# Patient Record
Sex: Female | Born: 1945 | Race: White | Hispanic: No | Marital: Married | Smoking: Never smoker
Health system: Southern US, Community
[De-identification: ages and names within clinical notes are randomized; demographics above are authoritative.]

## PROBLEM LIST (undated history)

## (undated) DIAGNOSIS — J45909 Unspecified asthma, uncomplicated: Secondary | ICD-10-CM

## (undated) DIAGNOSIS — K219 Gastro-esophageal reflux disease without esophagitis: Secondary | ICD-10-CM

## (undated) DIAGNOSIS — R7303 Prediabetes: Secondary | ICD-10-CM

## (undated) DIAGNOSIS — I10 Essential (primary) hypertension: Secondary | ICD-10-CM

## (undated) DIAGNOSIS — T8859XA Other complications of anesthesia, initial encounter: Secondary | ICD-10-CM

## (undated) DIAGNOSIS — M1990 Unspecified osteoarthritis, unspecified site: Secondary | ICD-10-CM

## (undated) DIAGNOSIS — J189 Pneumonia, unspecified organism: Secondary | ICD-10-CM

## (undated) DIAGNOSIS — E785 Hyperlipidemia, unspecified: Secondary | ICD-10-CM

## (undated) HISTORY — PX: EYE SURGERY: SHX253

## (undated) HISTORY — PX: BLADDER SURGERY: SHX569

## (undated) HISTORY — PX: HAMMER TOE SURGERY: SHX385

## (undated) HISTORY — PX: CARDIAC CATHETERIZATION: SHX172

## (undated) HISTORY — PX: GANGLION CYST EXCISION: SHX1691

---

## 2012-08-10 ENCOUNTER — Encounter: Payer: Self-pay | Admitting: Sports Medicine

## 2012-08-10 ENCOUNTER — Ambulatory Visit (INDEPENDENT_AMBULATORY_CARE_PROVIDER_SITE_OTHER): Payer: MEDICARE | Admitting: Sports Medicine

## 2012-08-10 VITALS — BP 169/83 | HR 60 | Ht 67.0 in | Wt 175.0 lb

## 2012-08-10 DIAGNOSIS — M25569 Pain in unspecified knee: Secondary | ICD-10-CM

## 2012-08-10 DIAGNOSIS — IMO0002 Reserved for concepts with insufficient information to code with codable children: Secondary | ICD-10-CM

## 2012-08-10 DIAGNOSIS — M25561 Pain in right knee: Secondary | ICD-10-CM

## 2012-08-10 DIAGNOSIS — M1710 Unilateral primary osteoarthritis, unspecified knee: Secondary | ICD-10-CM

## 2012-08-10 DIAGNOSIS — M25562 Pain in left knee: Secondary | ICD-10-CM

## 2012-08-10 NOTE — Assessment & Plan Note (Signed)
Received bilateral injections of lidocaine and depomedrol 40 mg . She will return if no relief from steroid injections.
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
## 2013-02-15 ENCOUNTER — Ambulatory Visit (INDEPENDENT_AMBULATORY_CARE_PROVIDER_SITE_OTHER): Payer: MEDICARE | Admitting: Sports Medicine

## 2013-02-15 ENCOUNTER — Encounter: Payer: Self-pay | Admitting: Sports Medicine

## 2013-02-15 ENCOUNTER — Ambulatory Visit
Admission: RE | Admit: 2013-02-15 | Discharge: 2013-02-15 | Disposition: A | Discharge status: Home / Self Care | Payer: MEDICARE | Source: Ambulatory Visit | Attending: Sports Medicine | Admitting: Sports Medicine

## 2013-02-15 VITALS — BP 175/95 | HR 65 | Ht 67.0 in | Wt 175.0 lb

## 2013-02-15 DIAGNOSIS — M25569 Pain in unspecified knee: Secondary | ICD-10-CM

## 2013-02-15 DIAGNOSIS — M25562 Pain in left knee: Secondary | ICD-10-CM

## 2013-02-15 DIAGNOSIS — M25561 Pain in right knee: Secondary | ICD-10-CM

## 2013-02-16 NOTE — Progress Notes (Signed)
  Subjective:    Patient ID: Sharon Wang, female    DOB: 1946-08-03, 67 y.o.   MRN: [id number]  HPI Patient comes in today complaining of persistent bilateral knee pain. She was last seen in the office back in August and I injected each of her knees with cortisone. Unfortunately, she did not get the same results as she had previously with injections. She describes a diffuse discomfort in the knees which is worse with activity. No swelling. Her pain is interfering with her quality of life. She's been using Voltaren gel which has been of very little benefit. She's also taking when necessary Ultram which does "take the edge off". No recent trauma. No mechanical symptoms.  Medications and past medical history is available for review on the chart.    Review of Systems     Objective:   Physical Exam Well-developed, well-nourished. No acute distress. Awake alert and oriented x3  Examination of each of her knees shows range of motion from 0-120. No effusion. Slight tenderness to palpation along both medial and lateral joint lines bilaterally. 1+ patellofemoral crepitus with active extension. She has a mild valgus thrust on the right with standing. Neurovascularly intact distally.       Assessment & Plan:  1. Persistent bilateral knee pain likely secondary to advanced DJD  X-rays of each of her knees including standing AP, lateral, and 30 flexion views in addition to a sunrise view. If advanced OA is seen patient would be interested in meeting with an orthopedic surgeon to discuss merits of total knee arthroplasty. If possible, she would like to have this done at [facility]. I will call her after I reviewed her x-rays.

## 2013-02-23 ENCOUNTER — Telehealth: Payer: Self-pay | Admitting: *Deleted

## 2013-02-23 ENCOUNTER — Telehealth: Payer: Self-pay | Admitting: Sports Medicine

## 2013-02-23 NOTE — Telephone Encounter (Signed)
I spoke with Sharon Dandy on the phone yesterday regarding x-rays of her knees. 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 Only mild degenerative changes of the left knee. She would like to see an orthopedist in [place]. I will refer her to Dr. Corinna Capra for further workup and treatment. She may be a candidate for a limited patellofemoral arthroplasty. She will followup with me when necessary

## 2013-02-23 NOTE — Telephone Encounter (Signed)
Sent referral info to [facility] at Dr. Amanda Pea office phone [phone], fax [phone]. She will call me back with appt info.

## 2013-02-23 NOTE — Telephone Encounter (Signed)
Message copied by Jacki Cones C on Tue Feb 23, 2013  2:20 PM ------      Message from: Reino Bellis R      Created: Mon Feb 22, 2013  1:30 PM      Regarding: referral       Please refer to Dr Pill in [place] for knee djd                  ----- Message -----         From: Rad Results In Interface         Sent: 02/15/2013   1:00 PM           To: Ralene Cork, DO                   ------
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
## 2013-03-02 NOTE — Telephone Encounter (Signed)
Pt scheduled for an appt with Dr. Corinna Capra 03/10/13 @ 3pm in the [place] office.

## 2014-03-29 IMAGING — CR DG KNEE COMPLETE 4+V*L*
4 series · 4 of 4 positions shown · non-contrast
Comparison: None.

CLINICAL DATA: Bilateral knee pain, no injury

LEFT KNEE - COMPLETE 4+ VIEW

[view not recorded (1 of 4)]
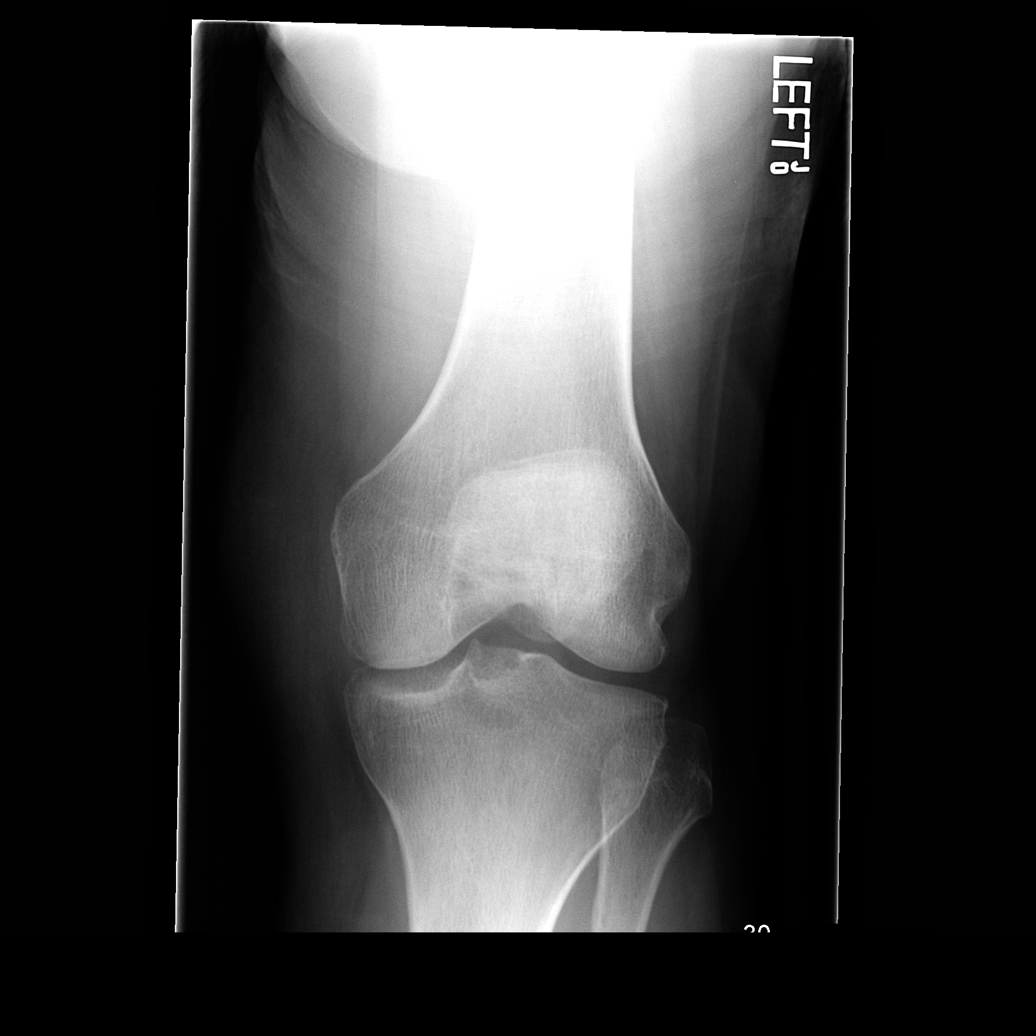

[view not recorded (2 of 4)]
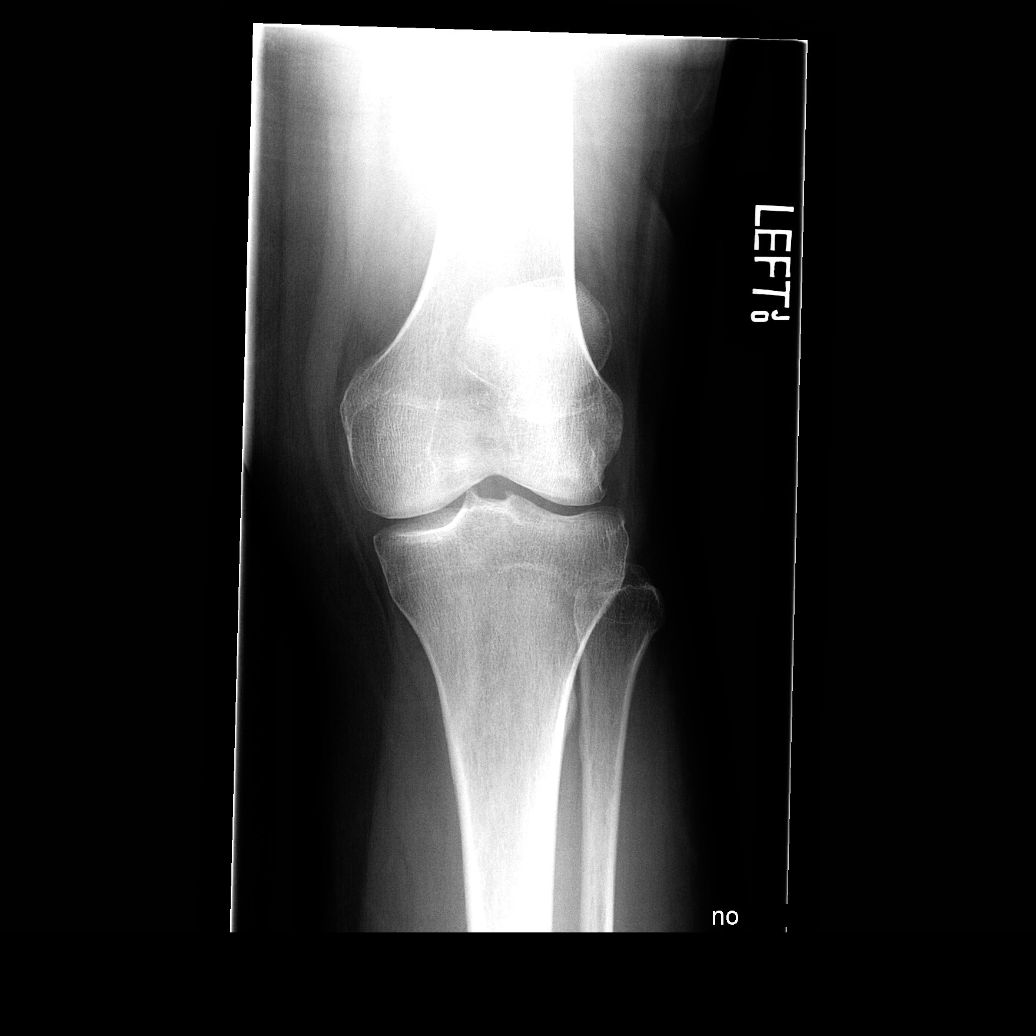

[view not recorded (3 of 4)]
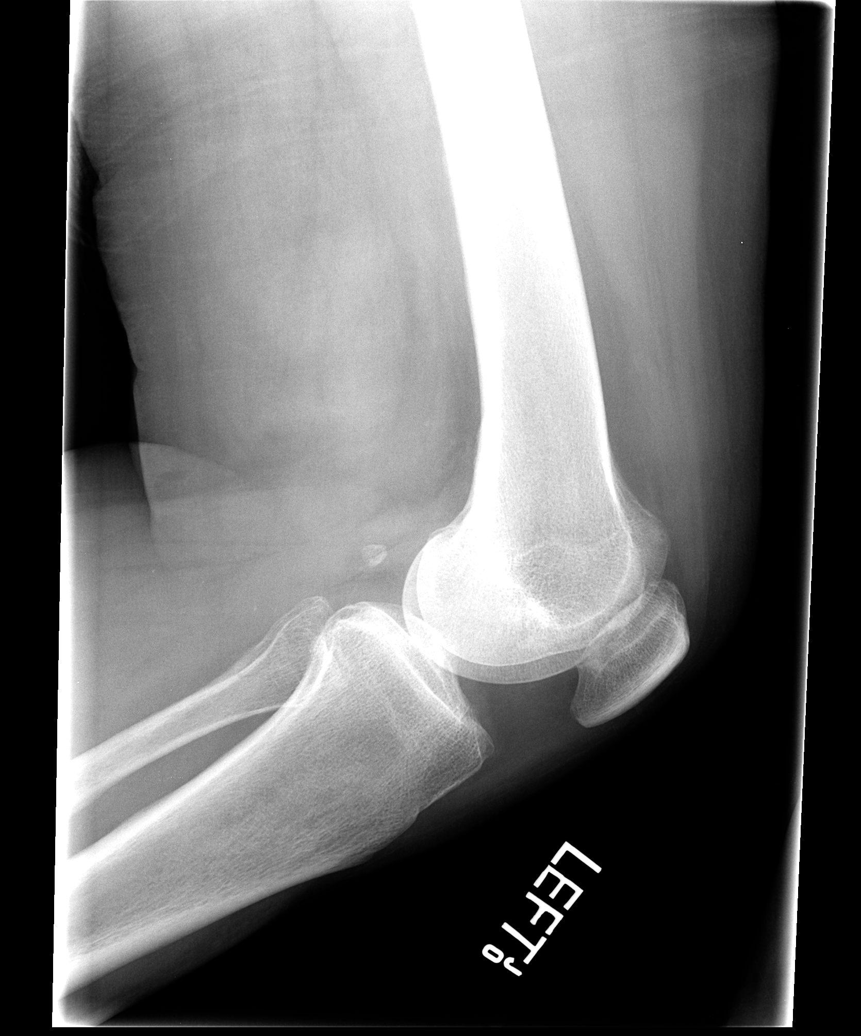

[view not recorded (4 of 4)]
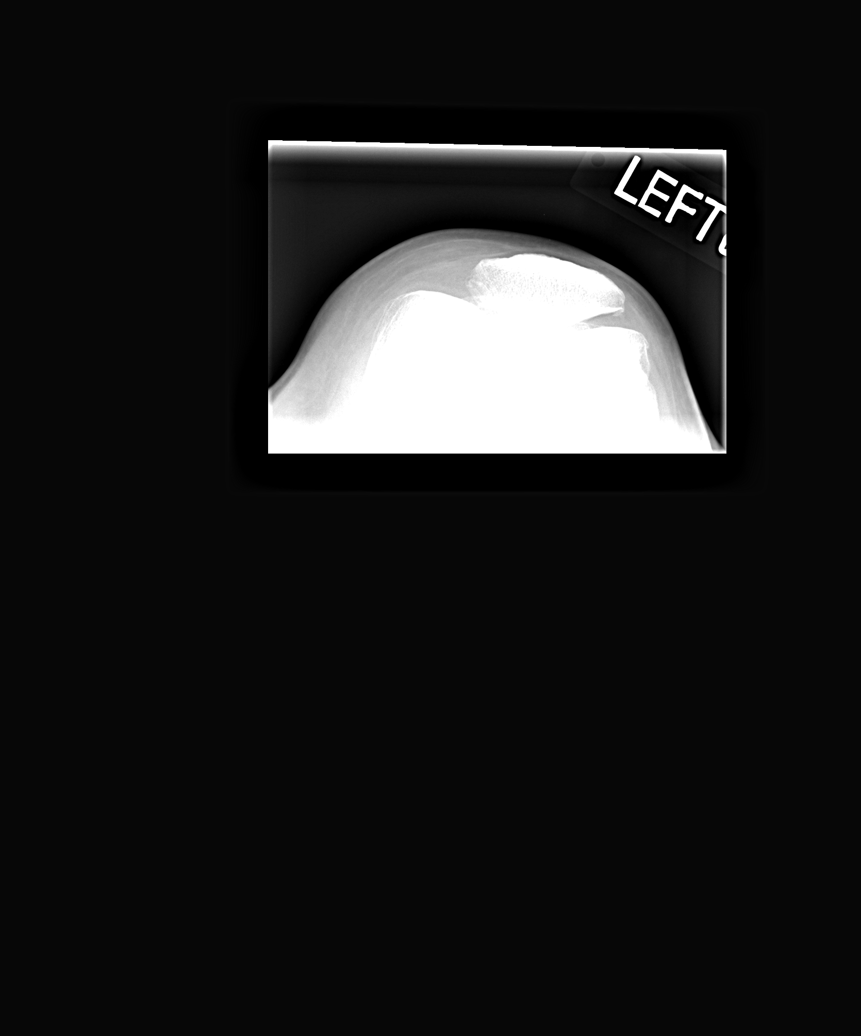

[4 of 4 positions shown; findings below may reference images not displayed]

FINDINGS: There is mild degenerative joint disease primarily
involving the patellofemoral articulation where there is some loss
of AC joint space and mild spurring.  The remainder joint spaces
appear relatively normal.  No effusion is seen.  The patella is
normally positioned within the patellofemoral groove.
IMPRESSION: Mild patellofemoral degenerative joint disease.

## 2018-06-19 ENCOUNTER — Other Ambulatory Visit: Payer: Self-pay | Admitting: Orthopedic Surgery

## 2018-07-02 ENCOUNTER — Encounter (HOSPITAL_BASED_OUTPATIENT_CLINIC_OR_DEPARTMENT_OTHER): Payer: Self-pay | Admitting: *Deleted

## 2018-07-03 ENCOUNTER — Other Ambulatory Visit: Payer: Self-pay

## 2018-07-03 ENCOUNTER — Encounter (HOSPITAL_BASED_OUTPATIENT_CLINIC_OR_DEPARTMENT_OTHER): Payer: Self-pay | Admitting: *Deleted

## 2018-07-09 ENCOUNTER — Ambulatory Visit (HOSPITAL_BASED_OUTPATIENT_CLINIC_OR_DEPARTMENT_OTHER): Payer: Medicare Other | Admitting: Anesthesiology

## 2018-07-09 ENCOUNTER — Encounter (HOSPITAL_BASED_OUTPATIENT_CLINIC_OR_DEPARTMENT_OTHER): Payer: Self-pay | Admitting: Anesthesiology

## 2018-07-09 ENCOUNTER — Encounter (HOSPITAL_BASED_OUTPATIENT_CLINIC_OR_DEPARTMENT_OTHER)
Admission: RE | Disposition: A | Discharge status: Home / Self Care | Payer: Self-pay | Source: Ambulatory Visit | Attending: Orthopedic Surgery

## 2018-07-09 ENCOUNTER — Ambulatory Visit (HOSPITAL_BASED_OUTPATIENT_CLINIC_OR_DEPARTMENT_OTHER)
Admission: RE | Admit: 2018-07-09 | Discharge: 2018-07-09 | Disposition: A | Discharge status: Home / Self Care | Payer: Medicare Other | Source: Ambulatory Visit | Attending: Orthopedic Surgery | Admitting: Orthopedic Surgery

## 2018-07-09 DIAGNOSIS — K219 Gastro-esophageal reflux disease without esophagitis: Secondary | ICD-10-CM | POA: Insufficient documentation

## 2018-07-09 DIAGNOSIS — J45909 Unspecified asthma, uncomplicated: Secondary | ICD-10-CM | POA: Insufficient documentation

## 2018-07-09 DIAGNOSIS — I10 Essential (primary) hypertension: Secondary | ICD-10-CM | POA: Insufficient documentation

## 2018-07-09 DIAGNOSIS — M25841 Other specified joint disorders, right hand: Secondary | ICD-10-CM | POA: Diagnosis present

## 2018-07-09 DIAGNOSIS — M19041 Primary osteoarthritis, right hand: Secondary | ICD-10-CM | POA: Diagnosis not present

## 2018-07-09 HISTORY — DX: Gastro-esophageal reflux disease without esophagitis: K21.9

## 2018-07-09 HISTORY — DX: Essential (primary) hypertension: I10

## 2018-07-09 HISTORY — DX: Unspecified asthma, uncomplicated: J45.909

## 2018-07-09 HISTORY — PX: MASS EXCISION: SHX2000

## 2018-07-09 HISTORY — DX: Unspecified osteoarthritis, unspecified site: M19.90

## 2018-07-09 SURGERY — EXCISION MASS
Anesthesia: Regional | Site: Hand | Laterality: Right

## 2018-07-09 MED ORDER — PROPOFOL 10 MG/ML IV BOLUS
INTRAVENOUS | Status: DC | PRN
  Administered 2018-07-09: 20 mg via INTRAVENOUS
  Administered 2018-07-09: 10 mg via INTRAVENOUS

## 2018-07-09 MED ORDER — VANCOMYCIN HCL IN DEXTROSE 1-5 GM/200ML-% IV SOLN
INTRAVENOUS | Status: AC
  Filled 2018-07-09: qty 200

## 2018-07-09 MED ORDER — LACTATED RINGERS IV SOLN
INTRAVENOUS | Status: DC
  Administered 2018-07-09: 08:00:00 via INTRAVENOUS

## 2018-07-09 MED ORDER — ONDANSETRON HCL 4 MG/2ML IJ SOLN
INTRAMUSCULAR | Status: AC
  Filled 2018-07-09: qty 2

## 2018-07-09 MED ORDER — ONDANSETRON HCL 4 MG/2ML IJ SOLN
INTRAMUSCULAR | Status: DC | PRN
  Administered 2018-07-09: 4 mg via INTRAVENOUS

## 2018-07-09 MED ORDER — VANCOMYCIN HCL IN DEXTROSE 1-5 GM/200ML-% IV SOLN: 1000.0000 mg | INTRAVENOUS | Status: DC

## 2018-07-09 MED ORDER — OXYCODONE HCL 5 MG/5ML PO SOLN
5.0000 mg | Freq: Once | ORAL | Status: DC | PRN
Start: 2018-07-09 — End: 2018-07-09

## 2018-07-09 MED ORDER — MEPERIDINE HCL 25 MG/ML IJ SOLN: 6.2500 mg | INTRAMUSCULAR | Status: DC | PRN

## 2018-07-09 MED ORDER — LIDOCAINE HCL (PF) 0.5 % IJ SOLN
INTRAMUSCULAR | Status: DC | PRN
  Administered 2018-07-09: 35 mL via INTRAVENOUS

## 2018-07-09 MED ORDER — FENTANYL CITRATE (PF) 100 MCG/2ML IJ SOLN
50.0000 ug | INTRAMUSCULAR | Status: DC | PRN
  Administered 2018-07-09: 2 ug via INTRAVENOUS

## 2018-07-09 MED ORDER — LIDOCAINE HCL (CARDIAC) PF 100 MG/5ML IV SOSY
PREFILLED_SYRINGE | INTRAVENOUS | Status: AC
  Filled 2018-07-09: qty 5

## 2018-07-09 MED ORDER — CEFAZOLIN SODIUM-DEXTROSE 2-4 GM/100ML-% IV SOLN
INTRAVENOUS | Status: AC
  Filled 2018-07-09: qty 100

## 2018-07-09 MED ORDER — LIDOCAINE HCL (CARDIAC) PF 100 MG/5ML IV SOSY
PREFILLED_SYRINGE | INTRAVENOUS | Status: DC | PRN
  Administered 2018-07-09: 20 mg via INTRAVENOUS

## 2018-07-09 MED ORDER — FENTANYL CITRATE (PF) 100 MCG/2ML IJ SOLN: 25.0000 ug | INTRAMUSCULAR | Status: DC | PRN

## 2018-07-09 MED ORDER — MIDAZOLAM HCL 2 MG/2ML IJ SOLN: 1.0000 mg | INTRAMUSCULAR | Status: DC | PRN

## 2018-07-09 MED ORDER — DEXAMETHASONE SODIUM PHOSPHATE 10 MG/ML IJ SOLN
INTRAMUSCULAR | Status: DC | PRN
  Administered 2018-07-09: 5 mg via INTRAVENOUS

## 2018-07-09 MED ORDER — CHLORHEXIDINE GLUCONATE 4 % EX LIQD: 60.0000 mL | Freq: Once | CUTANEOUS | Status: DC

## 2018-07-09 MED ORDER — CEFAZOLIN SODIUM-DEXTROSE 2-3 GM-%(50ML) IV SOLR
INTRAVENOUS | Status: DC | PRN
  Administered 2018-07-09: 2 g via INTRAVENOUS

## 2018-07-09 MED ORDER — SCOPOLAMINE 1 MG/3DAYS TD PT72: 1.0000 | MEDICATED_PATCH | Freq: Once | TRANSDERMAL | Status: DC | PRN

## 2018-07-09 MED ORDER — BUPIVACAINE HCL (PF) 0.25 % IJ SOLN
INTRAMUSCULAR | Status: DC | PRN
  Administered 2018-07-09: 8.5 mL

## 2018-07-09 MED ORDER — FENTANYL CITRATE (PF) 100 MCG/2ML IJ SOLN
INTRAMUSCULAR | Status: AC
  Filled 2018-07-09: qty 2

## 2018-07-09 MED ORDER — DEXAMETHASONE SODIUM PHOSPHATE 10 MG/ML IJ SOLN
INTRAMUSCULAR | Status: AC
  Filled 2018-07-09: qty 1

## 2018-07-09 MED ORDER — OXYCODONE HCL 5 MG PO TABS: 5.0000 mg | ORAL_TABLET | Freq: Once | ORAL | Status: DC | PRN

## 2018-07-09 SURGICAL SUPPLY — 40 items
BANDAGE COBAN STERILE 2 (GAUZE/BANDAGES/DRESSINGS) IMPLANT
BLADE SURG 15 STRL LF DISP TIS (BLADE) ×1 IMPLANT
BLADE SURG 15 STRL SS (BLADE) ×1
BNDG COHESIVE 1X5 TAN STRL LF (GAUZE/BANDAGES/DRESSINGS) ×2 IMPLANT
BNDG COHESIVE 3X5 TAN STRL LF (GAUZE/BANDAGES/DRESSINGS) IMPLANT
BNDG ESMARK 4X9 LF (GAUZE/BANDAGES/DRESSINGS) ×2 IMPLANT
BNDG GAUZE ELAST 4 BULKY (GAUZE/BANDAGES/DRESSINGS) IMPLANT
CHLORAPREP W/TINT 26ML (MISCELLANEOUS) ×2 IMPLANT
CORD BIPOLAR FORCEPS 12FT (ELECTRODE) ×2 IMPLANT
COVER BACK TABLE 60X90IN (DRAPES) ×2 IMPLANT
COVER MAYO STAND STRL (DRAPES) ×2 IMPLANT
CUFF TOURNIQUET SINGLE 18IN (TOURNIQUET CUFF) ×2 IMPLANT
DECANTER SPIKE VIAL GLASS SM (MISCELLANEOUS) IMPLANT
DRAIN PENROSE 1/2X12 LTX STRL (WOUND CARE) IMPLANT
DRAPE EXTREMITY T 121X128X90 (DRAPE) ×2 IMPLANT
DRAPE SURG 17X23 STRL (DRAPES) ×2 IMPLANT
GAUZE SPONGE 4X4 12PLY STRL (GAUZE/BANDAGES/DRESSINGS) ×2 IMPLANT
GAUZE XEROFORM 1X8 LF (GAUZE/BANDAGES/DRESSINGS) ×2 IMPLANT
GLOVE BIOGEL PI IND STRL 8.5 (GLOVE) ×1 IMPLANT
GLOVE BIOGEL PI INDICATOR 8.5 (GLOVE) ×1
GLOVE SURG ORTHO 8.0 STRL STRW (GLOVE) ×2 IMPLANT
GOWN STRL REUS W/ TWL LRG LVL3 (GOWN DISPOSABLE) ×1 IMPLANT
GOWN STRL REUS W/TWL LRG LVL3 (GOWN DISPOSABLE) ×1
GOWN STRL REUS W/TWL XL LVL3 (GOWN DISPOSABLE) ×2 IMPLANT
NDL SAFETY ECLIPSE 18X1.5 (NEEDLE) IMPLANT
NEEDLE HYPO 18GX1.5 SHARP (NEEDLE)
NEEDLE PRECISIONGLIDE 27X1.5 (NEEDLE) ×2 IMPLANT
NS IRRIG 1000ML POUR BTL (IV SOLUTION) ×2 IMPLANT
PACK BASIN DAY SURGERY FS (CUSTOM PROCEDURE TRAY) ×2 IMPLANT
PAD CAST 3X4 CTTN HI CHSV (CAST SUPPLIES) IMPLANT
PADDING CAST COTTON 3X4 STRL (CAST SUPPLIES)
SPLINT PLASTER CAST XFAST 3X15 (CAST SUPPLIES) IMPLANT
SPLINT PLASTER XTRA FASTSET 3X (CAST SUPPLIES)
STOCKINETTE 4X48 STRL (DRAPES) ×2 IMPLANT
SUT ETHILON 4 0 PS 2 18 (SUTURE) ×2 IMPLANT
SUT VIC AB 4-0 P2 18 (SUTURE) IMPLANT
SYR BULB 3OZ (MISCELLANEOUS) ×2 IMPLANT
SYR CONTROL 10ML LL (SYRINGE) ×2 IMPLANT
TOWEL GREEN STERILE FF (TOWEL DISPOSABLE) ×2 IMPLANT
UNDERPAD 30X30 (UNDERPADS AND DIAPERS) ×2 IMPLANT

## 2018-07-09 NOTE — Anesthesia Postprocedure Evaluation (Signed)
Anesthesia Post Note  Patient: Sharon Wang  Procedure(s) Performed: EXCISION CYST DEBRIDEMENT DISTAL INTERPHALANGEAL RIGHT INDEX FINGER (Right Hand)     Patient location during evaluation: PACU Anesthesia Type: Bier Block Level of consciousness: awake and alert Pain management: pain level controlled Vital Signs Assessment: post-procedure vital signs reviewed and stable Respiratory status: spontaneous breathing Cardiovascular status: stable Anesthetic complications: no    Last Vitals:  Vitals:   07/09/18 0955 07/09/18 1024  BP:  (!) 145/77  Pulse: (!) 55 (!) 52  Resp: 11 18  Temp:  36.6 C  SpO2: 100% 100%    Last Pain:  Vitals:   07/09/18 1024  TempSrc:   PainSc: 0-No pain                 Lewie LoronJohn Richanda Darin

## 2018-07-09 NOTE — Discharge Instructions (Addendum)

## 2018-07-09 NOTE — Anesthesia Procedure Notes (Signed)
Anesthesia Regional Block: Bier block (IV Regional)   Pre-Anesthetic Checklist: ,, timeout performed, Correct Patient, Correct Site, Correct Laterality, Correct Procedure,, site marked, surgical consent,, at surgeon's request  Laterality: Right     Needles:  Injection technique: Single-shot  Needle Type: Other      Needle Gauge: 20     Additional Needles:   Procedures:,,,,, intact distal pulses, Esmarch exsanguination, single tourniquet utilized,  Narrative:   Performed by: Personally       

## 2018-07-09 NOTE — Brief Op Note (Signed)
07/09/2018  9:48 AM  PATIENT:  Sharon PattyMary Wang  72 y.o. female  PRE-OPERATIVE DIAGNOSIS:  mucoid tumor DJD Right Index  POST-OPERATIVE DIAGNOSIS:  mucoid tumor DJD Right Index  PROCEDURE:  Procedure(s): EXCISION CYST DEBRIDEMENT DISTAL INTERPHALANGEAL RIGHT INDEX FINGER (Right)  SURGEON:  Surgeon(s) and Role:    Cindee Salt* Jamicheal Heard, MD - Primary  PHYSICIAN ASSISTANT:   ASSISTANTS: none   ANESTHESIA:   local, regional and IV sedation  EBL:  1 mL   BLOOD ADMINISTERED:none  DRAINS: none   LOCAL MEDICATIONS USED:  BUPIVICAINE   SPECIMEN:  Excision  DISPOSITION OF SPECIMEN:  PATHOLOGY  COUNTS:  YES  TOURNIQUET:   Total Tourniquet Time Documented: Forearm (Right) - 22 minutes Total: Forearm (Right) - 22 minutes   DICTATION: .Dragon Dictation  PLAN OF CARE: Discharge to home after PACU  PATIENT DISPOSITION:  PACU - hemodynamically stable.

## 2018-07-09 NOTE — H&P (Signed)
  Sharon PattyMary Wang is an 72 y.o. female.   Chief Complaint: mass index right HPI: Sharon Wang is a 72 year old right-hand-dominant female who is seen with a mass on her right index finger. This been present for 2 to 3 weeks. She recalls no history of injury. Is not causing her any pain or discomfort. She has no history of diabetes thyroid problems or gout. She does have history of arthritis. Negative for each of these also. She has not tried taking anything for this.      Past Medical History:  Diagnosis Date  . Arthritis    hands, knees and back  . Asthma   . GERD (gastroesophageal reflux disease)   . Hypertension     Past Surgical History:  Procedure Laterality Date  . BLADDER SURGERY    . CARDIAC CATHETERIZATION    . GANGLION CYST EXCISION Left   . HAMMER TOE SURGERY Left     History reviewed. No pertinent family history. Social History:  reports that she has never smoked. She has never used smokeless tobacco. She reports that she drinks alcohol. She reports that she does not use drugs.  Allergies:  Allergies  Allergen Reactions  . Norvasc [Amlodipine Besylate] Anaphylaxis    Aching joints  . Arthrotec [Diclofenac-Misoprostol] Hives  . Calcium Channel Blockers Hives  . Ciprofloxacin Hives  . Isosorbide   . Metronidazole Hives  . Penicillins Hives    Can take cefalexin  . Pravastatin Hives  . Clindamycin/Lincomycin Rash  . Sulfa Antibiotics Rash    No medications prior to admission.    No results found for this or any previous visit (from the past 48 hour(s)).  No results found.   Pertinent items are noted in HPI.  Height 5\' 7"  (1.702 m), weight 79.4 kg (175 lb).  General appearance: alert, cooperative and appears stated age Head: Normocephalic, without obvious abnormality Neck: no JVD Resp: clear to auscultation bilaterally Cardio: regular rate and rhythm, S1, S2 normal, no murmur, click, rub or gallop GI: soft, non-tender; bowel sounds normal; no masses,  no  organomegaly Extremities: mass right index finger Pulses: 2+ and symmetric Skin: Skin color, texture, turgor normal. No rashes or lesions Neurologic: Grossly normal Incision/Wound: na  Assessment/Plan Assessment:  1. Osteoarthritis of finger of right hand  2. Mucoid cyst, joint    Plan: Have discussed the etiology of this with her. We have discussed a surgical excision debridement of the joint. Pre-peri-and postoperative course are discussed along with risk complications. She is aware that there is no guarantee to the surgery the possibility of infection recurrence injury to arteries nerves tendons complete relief symptoms dystrophy. Would like to proceed. She will schedule for excision mucoid cyst debridement distal phalangeal joint right index fingers as an outpatient under regional anesthesia.      Kena Limon R 07/09/2018, 5:38 AM

## 2018-07-09 NOTE — Anesthesia Preprocedure Evaluation (Signed)
Anesthesia Evaluation  Patient identified by MRN, date of birth, ID band Patient awake    Reviewed: Allergy & Precautions, NPO status , Patient's Chart, lab work & pertinent test results, reviewed documented beta blocker date and time   Airway Mallampati: II  TM Distance: >3 FB Neck ROM: Full    Dental no notable dental hx.    Pulmonary asthma ,    Pulmonary exam normal breath sounds clear to auscultation       Cardiovascular hypertension, Pt. on home beta blockers and Pt. on medications Normal cardiovascular exam Rhythm:Regular Rate:Normal     Neuro/Psych negative neurological ROS  negative psych ROS   GI/Hepatic Neg liver ROS, GERD  ,  Endo/Other  negative endocrine ROS  Renal/GU negative Renal ROS     Musculoskeletal  (+) Arthritis ,   Abdominal   Peds  Hematology negative hematology ROS (+)   Anesthesia Other Findings   Reproductive/Obstetrics negative OB ROS                             Anesthesia Physical Anesthesia Plan  ASA: II  Anesthesia Plan: Bier Block and Bier Block-LIDOCAINE ONLY   Post-op Pain Management:    Induction: Intravenous  PONV Risk Score and Plan: 2 and Ondansetron, Propofol infusion and TIVA  Airway Management Planned:   Additional Equipment:   Intra-op Plan:   Post-operative Plan:   Informed Consent: I have reviewed the patients History and Physical, chart, labs and discussed the procedure including the risks, benefits and alternatives for the proposed anesthesia with the patient or authorized representative who has indicated his/her understanding and acceptance.   Dental advisory given  Plan Discussed with: CRNA  Anesthesia Plan Comments:         Anesthesia Quick Evaluation

## 2018-07-09 NOTE — Op Note (Signed)
NAME: Sharon Wang MEDICAL RECORD NO: [id number] DATE OF BIRTH: Sep 08, 1946 [facility] PHYSICIAN: Nicki ReaperGARY R. 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   ASSISTANT: none   ANESTHESIA:  Bier block with sedation   INTRAVENOUS FLUIDS:  Per anesthesia flow sheet.   ESTIMATED BLOOD LOSS:  Minimal.   COMPLICATIONS:  None.   SPECIMENS:   Osteophytes cyst distal phalangeal joint right index finger   TOURNIQUET TIME:    Total Tourniquet Time Documented: Forearm (Right) - 22 minutes Total: Forearm (Right) - 22 minutes    DISPOSITION:  Stable to PACU.   INDICATIONS: Patient is a 72 year old female with a history of a mass of the dorsal aspect distal interphalangeal joint right index finger.  This does transilluminate.  X-rays reveal degenerative arthritis of the distal phalangeal joint she is desirous having this excised.  Pre-peri-and postoperative course been discussed along with risks and complications.  She is aware that there is no guarantee to the surgery the possibility of infection recurrence injury to arteries nerves tendons complete relief symptoms dystrophy she is aware that the assistance due to the arthritis in his lungs arthritis is present today the cyst can reform.  Preoperatively the patient is seen extremity marked by both patient and surgeon antibiotic given  OPERATIVE COURSE: Patient brought to the operating room where a forearm-based IV regional anesthetic was carried out without difficulty.  She was prepped using ChloraPrep in supine position with the right arm free.  A three-minute dry time was allowed timeout taken to confirm patient  procedure.  The metacarpal block was given with quarter percent bupivacaine without epinephrine approximately 8 cc was used.  A curvilinear incision was made over the mass distal interphalangeal joint carried down the mid lateral line this was carried down through subcutaneous tissue.  The cyst was immediately encountered with blunt sharp dissection was dissected free.  The joint was opened.  A dorsal synovectomy was performed.  A hemostatic rondure was then used to remove osteophytes from the middle phalanx.  A curette was then used to smooth the area out this was a house curette.  The wound was copious irrigated with saline.  Specimen was sent to pathology.  The skin was then closed interrupted 4 nylon sutures.  Sterile compressive dressing and splint to the distal phalangeal joint applied.  Inflation of the tourniquet remaining fingers pink.  She was taken to the recovery room for observation in satisfactory condition.  She will be discharged home to [facility] in 1 week on Tylenol and ibuprofen she has Ultram to take at home.  This will be for breakthrough.   Nicki ReaperKUZMA,Solenne Manwarren R, MD Electronically signed, 07/09/18

## 2018-07-09 NOTE — Anesthesia Procedure Notes (Signed)
Procedure Name: MAC Performed by: Terrance Mass, CRNA Pre-anesthesia Checklist: Patient identified, Emergency Drugs available, Timeout performed, Suction available and Patient being monitored Patient Re-evaluated:Patient Re-evaluated prior to induction Oxygen Delivery Method: Simple face mask

## 2018-07-09 NOTE — Transfer of Care (Signed)
Immediate Anesthesia Transfer of Care Note  Patient: Sharon Wang  Procedure(s) Performed: EXCISION CYST DEBRIDEMENT DISTAL INTERPHALANGEAL RIGHT INDEX FINGER (Right Hand)  Patient Location: PACU  Anesthesia Type:MAC and Bier block  Level of Consciousness: awake, alert  and oriented  Airway & Oxygen Therapy: Patient Spontanous Breathing  Post-op Assessment: Report given to RN and Post -op Vital signs reviewed and stable  Post vital signs: Reviewed and stable  Last Vitals:  Vitals Value Taken Time  BP    Temp    Pulse 59 07/09/2018  9:45 AM  Resp 13 07/09/2018  9:45 AM  SpO2 100 % 07/09/2018  9:45 AM  Vitals shown include unvalidated device data.  Last Pain:  Vitals:   07/09/18 0738  TempSrc: Oral  PainSc: 0-No pain         Complications: No apparent anesthesia complications

## 2018-07-10 ENCOUNTER — Encounter (HOSPITAL_BASED_OUTPATIENT_CLINIC_OR_DEPARTMENT_OTHER): Payer: Self-pay | Admitting: Orthopedic Surgery

## 2019-10-26 ENCOUNTER — Other Ambulatory Visit: Payer: Self-pay | Admitting: Orthopaedic Surgery

## 2019-10-26 DIAGNOSIS — M47816 Spondylosis without myelopathy or radiculopathy, lumbar region: Secondary | ICD-10-CM

## 2019-11-24 ENCOUNTER — Other Ambulatory Visit: Payer: Self-pay | Admitting: Orthopaedic Surgery

## 2022-12-12 NOTE — Progress Notes (Signed)
Surgery orders requested via Epic inbox. °

## 2022-12-18 NOTE — Patient Instructions (Signed)
DUE TO COVID-19 ONLY TWO VISITORS  (aged 77 and older)  ARE ALLOWED TO COME WITH YOU AND STAY IN THE WAITING ROOM ONLY DURING PRE OP AND PROCEDURE.   **NO VISITORS ARE ALLOWED IN THE SHORT STAY AREA OR RECOVERY ROOM!!**  IF YOU WILL BE ADMITTED INTO THE HOSPITAL YOU ARE ALLOWED ONLY FOUR SUPPORT PEOPLE DURING VISITATION HOURS ONLY (7 AM -8PM)   The support person(s) must pass our screening, gel in and out, and wear a mask at all times, including in the patient's room. Patients must also wear a mask when staff or their support person are in the room. Visitors GUEST BADGE MUST BE WORN VISIBLY  One adult visitor may remain with you overnight and MUST be in the room by 8 P.M.     Your procedure is scheduled on: 12/31/22   Report to [facility] Main Entrance    Report to admitting at : 11:00 AM   Call this number if you have problems the morning of surgery [phone]   Do not eat food :After Midnight.   After Midnight you may have the following liquids until : 10:30 AM DAY OF SURGERY  Water Black Coffee (sugar ok, NO MILK/CREAM OR CREAMERS)  Tea (sugar ok, NO MILK/CREAM OR CREAMERS) regular and decaf                             Plain Jell-O (NO RED)                                           Fruit ices (not with fruit pulp, NO RED)                                     Popsicles (NO RED)                                                                  Juice: apple, WHITE grape, WHITE cranberry Sports drinks like Gatorade (NO RED)   The day of surgery:  Drink ONE (1) Pre-Surgery Clear Ensure or G2 at : 10:30 AM the morning of surgery. Drink in one sitting. Do not sip.  This drink was given to you during your hospital  pre-op appointment visit. Nothing else to drink after completing the  Pre-Surgery Clear Ensure or G2.          If you have questions, please contact your surgeon's office   Oral Hygiene is also important to reduce your risk of infection.                                     Remember - BRUSH YOUR TEETH THE MORNING OF SURGERY WITH YOUR REGULAR TOOTHPASTE  DENTURES WILL BE REMOVED PRIOR TO SURGERY PLEASE DO NOT APPLY "Poly grip" OR ADHESIVES!!!   Do NOT smoke after Midnight   Take these medicines the morning of surgery with A SIP OF WATER: loratadine,atenolol,diltiazem,omeprazole.Use inhalers as usual.Tramadol,norco as needed.  You may not have any metal on your body including hair pins, jewelry, and body piercing             Do not wear make-up, lotions, powders, perfumes/cologne, or deodorant  Do not wear nail polish including gel and S&S, artificial/acrylic nails, or any other type of covering on natural nails including finger and toenails. If you have artificial nails, gel coating, etc. that needs to be removed by a nail salon please have this removed prior to surgery or surgery may need to be canceled/ delayed if the surgeon/ anesthesia feels like they are unable to be safely monitored.   Do not shave  48 hours prior to surgery.    Do not bring valuables to the hospital. [facility] IS NOT             RESPONSIBLE   FOR VALUABLES.   Contacts, glasses, or bridgework may not be worn into surgery.   Bring small overnight bag day of surgery.   DO NOT BRING YOUR HOME MEDICATIONS TO THE HOSPITAL. PHARMACY WILL DISPENSE MEDICATIONS LISTED ON YOUR MEDICATION LIST TO YOU DURING YOUR ADMISSION IN THE HOSPITAL!    Patients discharged on the day of surgery will not be allowed to drive home.  Someone NEEDS to stay with you for the first 24 hours after anesthesia.   Special Instructions: Bring a copy of your healthcare power of attorney and living will documents         the day of surgery if you haven't scanned them before.              Please read over the following fact sheets you were given: IF YOU HAVE QUESTIONS ABOUT YOUR PRE-OP INSTRUCTIONS PLEASE CALL [phone]    [facility] - Preparing for Surgery Before surgery,  you can play an important role.  Because skin is not sterile, your skin needs to be as free of germs as possible.  You can reduce the number of germs on your skin by washing with CHG (chlorahexidine gluconate) soap before surgery.  CHG is an antiseptic cleaner which kills germs and bonds with the skin to continue killing germs even after washing. Please DO NOT use if you have an allergy to CHG or antibacterial soaps.  If your skin becomes reddened/irritated stop using the CHG and inform your nurse when you arrive at Short Stay. Do not shave (including legs and underarms) for at least 48 hours prior to the first CHG shower.  You may shave your face/neck. Please follow these instructions carefully:  1.  Shower with CHG Soap the night before surgery and the  morning of Surgery.  2.  If you choose to wash your hair, wash your hair first as usual with your  normal  shampoo.  3.  After you shampoo, rinse your hair and body thoroughly to remove the  shampoo.                           4.  Use CHG as you would any other liquid soap.  You can apply chg directly  to the skin and wash                       Gently with a scrungie or clean washcloth.  5.  Apply the CHG Soap to your body ONLY FROM THE NECK DOWN.   Do not use on face/ open  Wound or open sores. Avoid contact with eyes, ears mouth and genitals (private parts).                       Wash face,  Genitals (private parts) with your normal soap.             6.  Wash thoroughly, paying special attention to the area where your surgery  will be performed.  7.  Thoroughly rinse your body with warm water from the neck down.  8.  DO NOT shower/wash with your normal soap after using and rinsing off  the CHG Soap.                9.  Pat yourself dry with a clean towel.            10.  Wear clean pajamas.            11.  Place clean sheets on your bed the night of your first shower and do not  sleep with pets. Day of Surgery : Do not  apply any lotions/deodorants the morning of surgery.  Please wear clean clothes to the hospital/surgery center.  FAILURE TO FOLLOW THESE INSTRUCTIONS MAY RESULT IN THE CANCELLATION OF YOUR SURGERY PATIENT SIGNATURE_________________________________  NURSE SIGNATURE__________________________________  ________________________________________________________________________  Sharon Wang  An incentive spirometer is a tool that can help keep your lungs clear and active. This tool measures how well you are filling your lungs with each breath. Taking long deep breaths may help reverse or decrease the chance of developing breathing (pulmonary) problems (especially infection) following: A long period of time when you are unable to move or be active. BEFORE THE PROCEDURE  If the spirometer includes an indicator to show your best effort, your nurse or respiratory therapist will set it to a desired goal. If possible, sit up straight or lean slightly forward. Try not to slouch. Hold the incentive spirometer in an upright position. INSTRUCTIONS FOR USE  Sit on the edge of your bed if possible, or sit up as far as you can in bed or on a chair. Hold the incentive spirometer in an upright position. Breathe out normally. Place the mouthpiece in your mouth and seal your lips tightly around it. Breathe in slowly and as deeply as possible, raising the piston or the ball toward the top of the column. Hold your breath for 3-5 seconds or for as long as possible. Allow the piston or ball to fall to the bottom of the column. Remove the mouthpiece from your mouth and breathe out normally. Rest for a few seconds and repeat Steps 1 through 7 at least 10 times every 1-2 hours when you are awake. Take your time and take a few normal breaths between deep breaths. The spirometer may include an indicator to show your best effort. Use the indicator as a goal to work toward during each repetition. After each set of  10 deep breaths, practice coughing to be sure your lungs are clear. If you have an incision (the cut made at the time of surgery), support your incision when coughing by placing a pillow or rolled up towels firmly against it. Once you are able to get out of bed, walk around indoors and cough well. You may stop using the incentive spirometer when instructed by your caregiver.  RISKS AND COMPLICATIONS Take your time so you do not get dizzy or light-headed. If you are in pain, you may need to take or ask  for pain medication before doing incentive spirometry. It is harder to take a deep breath if you are having pain. AFTER USE Rest and breathe slowly and easily. It can be helpful to keep track of a log of your progress. Your caregiver can provide you with a simple table to help with this. If you are using the spirometer at home, follow these instructions: [place] IF:  You are having difficultly using the spirometer. You have trouble using the spirometer as often as instructed. Your pain medication is not giving enough relief while using the spirometer. You develop fever of 100.5 F (38.1 C) or higher. SEEK IMMEDIATE MEDICAL CARE IF:  You cough up bloody sputum that had not been present before. You develop fever of 102 F (38.9 C) or greater. You develop worsening pain at or near the incision site. MAKE SURE YOU:  Understand these instructions. Will watch your condition. Will get help right away if you are not doing well or get worse. Document Released: 04/14/2007 Document Revised: 02/24/2012 Document Reviewed: 06/15/2007 [facility] Patient Information 2014 [place], [place].   ________________________________________________________________________

## 2022-12-19 ENCOUNTER — Encounter (HOSPITAL_COMMUNITY)
Admission: RE | Admit: 2022-12-19 | Discharge: 2022-12-19 | Disposition: A | Discharge status: Home / Self Care | Payer: Medicare Other | Source: Ambulatory Visit | Attending: Anesthesiology | Admitting: Anesthesiology

## 2022-12-19 DIAGNOSIS — Z01818 Encounter for other preprocedural examination: Secondary | ICD-10-CM

## 2022-12-19 DIAGNOSIS — I2510 Atherosclerotic heart disease of native coronary artery without angina pectoris: Secondary | ICD-10-CM

## 2022-12-31 ENCOUNTER — Ambulatory Visit: Admit: 2022-12-31 | Payer: Medicare Other | Admitting: Orthopedic Surgery

## 2022-12-31 SURGERY — ARTHROPLASTY, KNEE, TOTAL
Anesthesia: Choice | Site: Knee | Laterality: Right

## 2023-03-05 NOTE — Progress Notes (Signed)
Sent message, via epic in basket, requesting orders in epic from surgeon.  

## 2023-03-10 NOTE — Progress Notes (Signed)
COVID Vaccine Completed: yes  Date of COVID positive in last 90 days:  PCP - Maxcine Ham, MD Cardiologist - Roslynn Amble, MD  Cardiac clearance by Lucy Antigua 02/03/23 in Epic  Chest x-ray - 11/29/22 [facility] EKG - 02/03/23 [facility] req tracing  Stress Test -  ECHO -  Cardiac Cath - 08/20/17 [facility] Pacemaker/ICD device last checked: Spinal Cord Stimulator:  Bowel Prep -   Sleep Study -  CPAP -   Fasting Blood Sugar - preDM Checks Blood Sugar _____ times a day  Last dose of GLP1 agonist-  N/A GLP1 instructions:  N/A   Last dose of SGLT-2 inhibitors-  N/A SGLT-2 instructions: N/A   Blood Thinner Instructions: Aspirin Instructions: ASA 81 Last Dose:  Activity level:  Can go up a flight of stairs and perform activities of daily living without stopping and without symptoms of chest pain or shortness of breath.  Able to exercise without symptoms  Unable to go up a flight of stairs without symptoms of     Anesthesia review: palpitations, HTN, CAD, a fib caused by sedation medications  Patient denies shortness of breath, fever, cough and chest pain at PAT appointment  Patient verbalized understanding of instructions that were given to them at the PAT appointment. Patient was also instructed that they will need to review over the PAT instructions again at home before surgery.

## 2023-03-11 NOTE — Patient Instructions (Signed)
SURGICAL WAITING ROOM VISITATION  Patients having surgery or a procedure may have no more than 2 support people in the waiting area - these visitors may rotate.    Children under the age of 39 must have an adult with them who is not the patient.  Due to an increase in RSV and influenza rates and associated hospitalizations, children ages 54 and under may not visit patients in [place].  If the patient needs to stay at the hospital during part of their recovery, the visitor guidelines for inpatient rooms apply. Pre-op nurse will coordinate an appropriate time for 1 support person to accompany patient in pre-op.  This support person may not rotate.    Please refer to the [facility] website for the visitor guidelines for Inpatients (after your surgery is over and you are in a regular room).    Your procedure is scheduled on: 03/25/23   Report to [facility] Main Entrance    Report to admitting at 5:15 AM   Call this number if you have problems the morning of surgery [phone]   Do not eat food :After Midnight.   After Midnight you may have the following liquids until 4:30 AM DAY OF SURGERY  Water Non-Citrus Juices (without pulp, NO RED-Apple, White grape, White cranberry) Black Coffee (NO MILK/CREAM OR CREAMERS, sugar ok)  Clear Tea (NO MILK/CREAM OR CREAMERS, sugar ok) regular and decaf                             Plain Jell-O (NO RED)                                           Fruit ices (not with fruit pulp, NO RED)                                     Popsicles (NO RED)                                                               Sports drinks like Gatorade (NO RED)                 The day of surgery:  Drink ONE (1) Pre-Surgery G2 at 4:30 AM the morning of surgery. Drink in one sitting. Do not sip.  This drink was given to you during your hospital  pre-op appointment visit. Nothing else to drink after completing the  Pre-Surgery G2.          If you  have questions, please contact your surgeon's office.   FOLLOW BOWEL PREP AND ANY ADDITIONAL PRE OP INSTRUCTIONS YOU RECEIVED FROM YOUR SURGEON'S OFFICE!!!     Oral Hygiene is also important to reduce your risk of infection.                                    Remember - BRUSH YOUR TEETH THE MORNING OF SURGERY WITH YOUR REGULAR TOOTHPASTE  DENTURES WILL BE REMOVED  PRIOR TO SURGERY PLEASE DO NOT APPLY "Poly grip" OR ADHESIVES!!!   Take these medicines the morning of surgery with A SIP OF WATER: Atorvastatin, Carvedilol, Diltiazem, Inhalers, Claritin, Omeprazole, Tramadol                              You may not have any metal on your body including hair pins, jewelry, and body piercing             Do not wear make-up, lotions, powders, perfumes, or deodorant  Do not wear nail polish including gel and S&S, artificial/acrylic nails, or any other type of covering on natural nails including finger and toenails. If you have artificial nails, gel coating, etc. that needs to be removed by a nail salon please have this removed prior to surgery or surgery may need to be canceled/ delayed if the surgeon/ anesthesia feels like they are unable to be safely monitored.   Do not shave  48 hours prior to surgery.    Do not bring valuables to the hospital. [place].   Contacts, glasses, dentures or bridgework may not be worn into surgery.  DO NOT [place]. PHARMACY WILL DISPENSE MEDICATIONS LISTED ON YOUR MEDICATION LIST TO YOU DURING YOUR ADMISSION [place]!    Patients discharged on the day of surgery will not be allowed to drive home.  Someone NEEDS to stay with you for the first 24 hours after anesthesia.   Special Instructions: Bring a copy of your healthcare power of attorney and living will documents the day of surgery if you haven't scanned them before.              Please read over the following fact  sheets you were given: IF [place] [phone] Schneiders    If you received a COVID test during your pre-op visit  it is requested that you wear a mask when out in public, stay away from anyone that may not be feeling well and notify your surgeon if you develop symptoms. If you test positive for Covid or have been in contact with anyone that has tested positive in the last 10 days please notify you surgeon.    [facility] - Preparing for Surgery Before surgery, you can play an important role.  Because skin is not sterile, your skin needs to be as free of germs as possible.  You can reduce the number of germs on your skin by washing with CHG (chlorahexidine gluconate) soap before surgery.  CHG is an antiseptic cleaner which kills germs and bonds with the skin to continue killing germs even after washing. Please DO NOT use if you have an allergy to CHG or antibacterial soaps.  If your skin becomes reddened/irritated stop using the CHG and inform your nurse when you arrive at Short Stay. Do not shave (including legs and underarms) for at least 48 hours prior to the first CHG shower.  You may shave your face/neck.  Please follow these instructions carefully:  1.  Shower with CHG Soap the night before surgery and the  morning of surgery.  2.  If you choose to wash your hair, wash your hair first as usual with your normal  shampoo.  3.  After you shampoo, rinse your hair  and body thoroughly to remove the shampoo.                             4.  Use CHG as you would any other liquid soap.  You can apply chg directly to the skin and wash.  Gently with a scrungie or clean washcloth.  5.  Apply the CHG Soap to your body ONLY FROM THE NECK DOWN.   Do   not use on face/ open                           Wound or open sores. Avoid contact with eyes, ears mouth and   genitals (private parts).                       Wash face,  Genitals (private parts) with your  normal soap.             6.  Wash thoroughly, paying special attention to the area where your    surgery  will be performed.  7.  Thoroughly rinse your body with warm water from the neck down.  8.  DO NOT shower/wash with your normal soap after using and rinsing off the CHG Soap.                9.  Pat yourself dry with a clean towel.            10.  Wear clean pajamas.            11.  Place clean sheets on your bed the night of your first shower and do not  sleep with pets. Day of Surgery : Do not apply any lotions/deodorants the morning of surgery.  Please wear clean clothes to the hospital/surgery center.  FAILURE TO FOLLOW THESE INSTRUCTIONS MAY RESULT IN THE CANCELLATION OF YOUR SURGERY  PATIENT SIGNATURE_________________________________  NURSE SIGNATURE__________________________________  ________________________________________________________________________  Adam Phenix  An incentive spirometer is a tool that can help keep your lungs clear and active. This tool measures how well you are filling your lungs with each breath. Taking long deep breaths may help reverse or decrease the chance of developing breathing (pulmonary) problems (especially infection) following: A long period of time when you are unable to move or be active. BEFORE THE PROCEDURE  If the spirometer includes an indicator to show your best effort, your nurse or respiratory therapist will set it to a desired goal. If possible, sit up straight or lean slightly forward. Try not to slouch. Hold the incentive spirometer in an upright position. INSTRUCTIONS FOR USE  Sit on the edge of your bed if possible, or sit up as far as you can in bed or on a chair. Hold the incentive spirometer in an upright position. Breathe out normally. Place the mouthpiece in your mouth and seal your lips tightly around it. Breathe in slowly and as deeply as possible, raising the piston or the ball toward the top of the  column. Hold your breath for 3-5 seconds or for as long as possible. Allow the piston or ball to fall to the bottom of the column. Remove the mouthpiece from your mouth and breathe out normally. Rest for a few seconds and repeat Steps 1 through 7 at least 10 times every 1-2 hours when you are awake. Take your time and take a few normal breaths between deep breaths. The  spirometer may include an indicator to show your best effort. Use the indicator as a goal to work toward during each repetition. After each set of 10 deep breaths, practice coughing to be sure your lungs are clear. If you have an incision (the cut made at the time of surgery), support your incision when coughing by placing a pillow or rolled up towels firmly against it. Once you are able to get out of bed, walk around indoors and cough well. You may stop using the incentive spirometer when instructed by your caregiver.  RISKS AND COMPLICATIONS Take your time so you do not get dizzy or light-headed. If you are in pain, you may need to take or ask for pain medication before doing incentive spirometry. It is harder to take a deep breath if you are having pain. AFTER USE Rest and breathe slowly and easily. It can be helpful to keep track of a log of your progress. Your caregiver can provide you with a simple table to help with this. If you are using the spirometer at home, follow these instructions: [place] IF:  You are having difficultly using the spirometer. You have trouble using the spirometer as often as instructed. Your pain medication is not giving enough relief while using the spirometer. You develop fever of 100.5 F (38.1 C) or higher. SEEK IMMEDIATE MEDICAL CARE IF:  You cough up bloody sputum that had not been present before. You develop fever of 102 F (38.9 C) or greater. You develop worsening pain at or near the incision site. MAKE SURE YOU:  Understand these instructions. Will watch your  condition. Will get help right away if you are not doing well or get worse. Document Released: 04/14/2007 Document Revised: 02/24/2012 Document Reviewed: 06/15/2007 [facility] Patient Information 2014 [place], [place].   ________________________________________________________________________

## 2023-03-12 ENCOUNTER — Encounter (HOSPITAL_COMMUNITY)
Admission: RE | Admit: 2023-03-12 | Discharge: 2023-03-12 | Disposition: A | Discharge status: Home / Self Care | Payer: Medicare Other | Source: Ambulatory Visit | Attending: Orthopedic Surgery | Admitting: Orthopedic Surgery

## 2023-03-12 ENCOUNTER — Encounter (HOSPITAL_COMMUNITY): Payer: Self-pay

## 2023-03-12 ENCOUNTER — Other Ambulatory Visit: Payer: Self-pay

## 2023-03-12 VITALS — BP 173/86 | HR 65 | Temp 98.4°F | Resp 14 | Ht 66.0 in | Wt 167.0 lb

## 2023-03-12 DIAGNOSIS — I10 Essential (primary) hypertension: Secondary | ICD-10-CM | POA: Insufficient documentation

## 2023-03-12 DIAGNOSIS — Z01812 Encounter for preprocedural laboratory examination: Secondary | ICD-10-CM | POA: Insufficient documentation

## 2023-03-12 DIAGNOSIS — R7303 Prediabetes: Secondary | ICD-10-CM | POA: Diagnosis not present

## 2023-03-12 DIAGNOSIS — Z01818 Encounter for other preprocedural examination: Secondary | ICD-10-CM

## 2023-03-12 HISTORY — DX: Pneumonia, unspecified organism: J18.9

## 2023-03-12 HISTORY — DX: Other complications of anesthesia, initial encounter: T88.59XA

## 2023-03-12 LAB — BASIC METABOLIC PANEL
Anion gap: 8 (ref 5–15)
BUN: 14 mg/dL (ref 8–23)
CO2: 26 mmol/L (ref 22–32)
Calcium: 9.3 mg/dL (ref 8.9–10.3)
Chloride: 105 mmol/L (ref 98–111)
Creatinine, Ser: 0.57 mg/dL (ref 0.44–1.00)
GFR, Estimated: >60 mL/min (ref >60)
Glucose, Bld: 110 mg/dL — ABNORMAL HIGH (ref 70–99)
Potassium: 4 mmol/L (ref 3.5–5.1)
Sodium: 139 mmol/L (ref 135–145)

## 2023-03-12 LAB — CBC
HCT: 41.7 % (ref 36.0–46.0)
Hemoglobin: 13.6 g/dL (ref 12.0–15.0)
MCH: 29.2 pg (ref 26.0–34.0)
MCHC: 32.6 g/dL (ref 30.0–36.0)
MCV: 89.7 fL (ref 80.0–100.0)
Platelets: 221 10*3/uL (ref 150–400)
RBC: 4.65 MIL/uL (ref 3.87–5.11)
RDW: 13.3 % (ref 11.5–15.5)
WBC: 10.7 10*3/uL — ABNORMAL HIGH (ref 4.0–10.5)
nRBC: 0 % (ref 0.0–0.2)

## 2023-03-12 LAB — SURGICAL PCR SCREEN
MRSA, PCR: NEGATIVE
Staphylococcus aureus: POSITIVE — AB

## 2023-03-13 LAB — HEMOGLOBIN A1C
Hgb A1c MFr Bld: 5.7 % — ABNORMAL HIGH (ref 4.8–5.6)
Mean Plasma Glucose: 117 mg/dL

## 2023-03-13 NOTE — Anesthesia Preprocedure Evaluation (Addendum)
Anesthesia Evaluation  Patient identified by MRN, date of birth, ID band Patient awake    Reviewed: Allergy & Precautions, NPO status , Patient's Chart, lab work & pertinent test results  History of Anesthesia Complications Negative for: history of anesthetic complications  Airway Mallampati: II  TM Distance: >3 FB Neck ROM: Full    Dental no notable dental hx. (+) Dental Advisory Given, Teeth Intact   Pulmonary asthma , pneumonia   Pulmonary exam normal breath sounds clear to auscultation       Cardiovascular hypertension, Pt. on home beta blockers and Pt. on medications Normal cardiovascular exam+ dysrhythmias Atrial Fibrillation  Rhythm:Regular Rate:Normal     Neuro/Psych negative neurological ROS  negative psych ROS   GI/Hepatic Neg liver ROS,GERD  ,,  Endo/Other  negative endocrine ROS    Renal/GU negative Renal ROS     Musculoskeletal  (+) Arthritis ,    Abdominal   Peds  Hematology negative hematology ROS (+)   Anesthesia Other Findings   Reproductive/Obstetrics negative OB ROS                             Anesthesia Physical Anesthesia Plan  ASA: 3  Anesthesia Plan: Spinal   Post-op Pain Management: Regional block* and Tylenol PO (pre-op)*   Induction: Intravenous  PONV Risk Score and Plan: 3 and Ondansetron, Propofol infusion, TIVA, Dexamethasone and Treatment may vary due to age or medical condition  Airway Management Planned: Natural Airway  Additional Equipment:   Intra-op Plan:   Post-operative Plan:   Informed Consent: I have reviewed the patients History and Physical, chart, labs and discussed the procedure including the risks, benefits and alternatives for the proposed anesthesia with the patient or authorized representative who has indicated his/her understanding and acceptance.     Dental advisory given  Plan Discussed with: CRNA  Anesthesia Plan  Comments: (See APP note by Joslyn Hy, FNP )        Anesthesia Quick Evaluation

## 2023-03-13 NOTE — Progress Notes (Signed)
[facility] results routed to Dr. Landau  

## 2023-03-13 NOTE — Progress Notes (Signed)
Anesthesia Chart Review:   Case: [id number] Date/Time: 03/25/23 0715   Procedure: TOTAL KNEE ARTHROPLASTY (Right: Knee)   Anesthesia type: Spinal   Pre-op diagnosis: right knee DJD   Location: [facility] 04 / [facility]   Surgeons: Marchia Bond, MD       DISCUSSION: Pt is 77 years old with hx CAD, HTN, afib, asthma  Reportedly had afib during dental procedure under anesthesia 11/2022; 30 day cardiac monitor was unremarkable. Event attributed to sedation medications  VS: BP (!) 173/86   Pulse 65   Temp 36.9 C (Oral)   Resp 14   Ht 5\' 6"  (1.676 m)   Wt 75.8 kg   SpO2 98%   BMI 26.95 kg/m   PROVIDERS: - PCP is Geraldo Pitter, MD. Last office visit 02/20/23 (notes in care everywhere)  - Cardiologist is Roslynn Amble, MD. Cleared for surgery at low risk at last office visit 02/03/23 with Lucy Antigua, NP (notes in care everywhere)     LABS: Labs reviewed: Acceptable for surgery. (all labs ordered are listed, but only abnormal results are displayed)  Labs Reviewed  SURGICAL PCR SCREEN - Abnormal; Notable for the following components:      Result Value   Staphylococcus aureus POSITIVE (*)    All other components within normal limits  HEMOGLOBIN A1C - Abnormal; Notable for the following components:   Hgb A1c MFr Bld 5.7 (*)    All other components within normal limits  BASIC METABOLIC PANEL - Abnormal; Notable for the following components:   Glucose, Bld 110 (*)    All other components within normal limits  CBC - Abnormal; Notable for the following components:   WBC 10.7 (*)    All other components within normal limits     IMAGES: CXR 11/29/22 (care everywhere): 1. No acute cardiopulmonary process.   EKG 02/03/23 [facility]- report in care everywhere - tracing requested): Sinus Rhythm  Low voltage in precordial leads.  -Poor R-wave progression -may be secondary to pulmonary disease  consider old anterior infarct.    CV: Cardiac event monitor 01/22/23 (30 day  at [facility]):  - underlying rhythm of sinus with a minimum heart rate of 45 bpm, maximum 146 bpm, and average 64 bpm.  - There were no episodes of atrial fibrillation/flutter, heart blocks, or pauses.  - She had episodes of SVT with the longest lasting 27 beats.  - PVC and PAC burden was less than 1%.   Past Medical History:  Diagnosis Date   Arthritis    hands, knees and back   Asthma    Complication of anesthesia    a fib during dental surgery   GERD (gastroesophageal reflux disease)    Hypertension    Pneumonia     Past Surgical History:  Procedure Laterality Date   BLADDER SURGERY     CARDIAC CATHETERIZATION     GANGLION CYST EXCISION Left    HAMMER TOE SURGERY Left    MASS EXCISION Right 07/09/2018   Procedure: EXCISION CYST DEBRIDEMENT DISTAL INTERPHALANGEAL RIGHT INDEX FINGER;  Surgeon: Daryll Brod, MD;  Location: [facility];  Service: Orthopedics;  Laterality: Right;    MEDICATIONS:  amitriptyline (ELAVIL) 10 MG tablet   aspirin EC 81 MG tablet   atorvastatin (LIPITOR) 40 MG tablet   Biotin w/ Vitamins C & E (HAIR SKIN & NAILS GUMMIES PO)   Calcium-Phosphorus-Vitamin D (CALCIUM GUMMIES PO)   carvedilol (COREG) 25 MG tablet   celecoxib (CELEBREX) 200 MG capsule  Coenzyme Q10 (COQ-10 PO)   CRANBERRY PO   diltiazem (CARDIZEM CD) 240 MG 24 hr capsule   FLOVENT HFA 110 MCG/ACT inhaler   Glucosamine-Chondroitin (OSTEO BI-FLEX REGULAR STRENGTH PO)   Lactobacillus-Inulin (CULTURELLE DIGESTIVE HEALTH PO)   loratadine (CLARITIN) 10 MG tablet   Lutein 40 MG CAPS   Metamucil Fiber CHEW   omeprazole (PRILOSEC) 20 MG capsule   OVER THE COUNTER MEDICATION   Pediatric Multivit-Minerals (FLINTSTONES COMPLETE PO)   traMADol (ULTRAM) 50 MG tablet   Vibegron (GEMTESA) 75 MG TABS   No current facility-administered medications for this encounter.   If no changes, I anticipate pt can proceed with surgery as scheduled.   Willeen Cass, PhD, FNP-BC [facility]  [facility]/Anesthesiology Phone: [phone] 03/13/2023 3:05 PM

## 2023-03-24 NOTE — H&P (Signed)
KNEE ARTHROPLASTY ADMISSION H&P  Patient ID: Sharon Wang MRN: [id number] DOB/AGE: [birth year] 77 y.o.  Chief Complaint: right knee pain.  Planned Procedure Date: 03/25/23 Medical Clearance by Dr. Jaci Lazier Cardiac Clearance by Dr. Gwendolyn Lima   HPI: Sharon Wang is a 77 y.o. female who presents for evaluation of right knee DJD. The patient has a history of pain and functional disability in the right knee due to arthritis and has failed non-surgical conservative treatments for greater than 12 weeks to include NSAID's and/or analgesics, corticosteriod injections, viscosupplementation injections, and activity modification.  Onset of symptoms was gradual, starting 5 years ago with gradually worsening course since that time. The patient noted no past surgery on the right knee.  Patient currently rates pain at 4 out of 10 with activity. Patient has worsening of pain with activity and weight bearing and pain that interferes with activities of daily living.  Patient has evidence of joint space narrowing by imaging studies.  There is no active infection.  Past Medical History:  Diagnosis Date   Arthritis    hands, knees and back   Asthma    Complication of anesthesia    a fib during dental surgery   GERD (gastroesophageal reflux disease)    Hypertension    Pneumonia    Past Surgical History:  Procedure Laterality Date   BLADDER SURGERY     CARDIAC CATHETERIZATION     GANGLION CYST EXCISION Left    HAMMER TOE SURGERY Left    MASS EXCISION Right 07/09/2018   Procedure: EXCISION CYST DEBRIDEMENT DISTAL INTERPHALANGEAL RIGHT INDEX FINGER;  Surgeon: Cindee Salt, MD;  Location: [facility];  Service: Orthopedics;  Laterality: Right;   Allergies  Allergen Reactions   Aspirin Nausea And Vomiting, Other (See Comments) and Anaphylaxis    High dose; upsets her GI tract  Other reaction(s): Other (See Comments)  High dose; upsets her GI tract  High dose; upsets her GI tract   Norvasc  [Amlodipine Besylate] Anaphylaxis    Aching joints   Lyrica [Pregabalin] Other (See Comments)    Ineffective   High dose "makes me a zombie"   Arthrotec [Diclofenac-Misoprostol] Hives   Calcium Channel Blockers Hives   Ciprofloxacin Hives    Patient states the ONLY antibiotic she tolerates is Keflex (cephalexin)   Flagyl [Metronidazole] Hives    Patient states the ONLY antibiotic she tolerates is Keflex (cephalexin)   Isosorbide Other (See Comments)    Headaches.   Penicillins Hives    Patient states the ONLY antibiotic she tolerates is Keflex (cephalexin)   Pravachol [Pravastatin] Hives   Clindamycin/Lincomycin Rash    Patient states the ONLY antibiotic she tolerates is Keflex (cephalexin)   Oxycodone-Acetaminophen Nausea Only    Other Reaction(s): GI Intolerance   Sulfa Antibiotics Rash    Patient states the ONLY antibiotic she tolerates is Keflex (cephalexin)   Voltaren [Diclofenac] Rash   Prior to Admission medications   Medication Sig Start Date End Date Taking? Authorizing Provider  amitriptyline (ELAVIL) 10 MG tablet Take 10 mg by mouth at bedtime.   Yes [provider]  aspirin EC 81 MG tablet Take 81 mg by mouth in the morning.   Yes [provider]  atorvastatin (LIPITOR) 40 MG tablet Take 40 mg by mouth daily with breakfast. 08/01/12  Yes [provider]  Biotin w/ Vitamins C & E (HAIR SKIN & NAILS GUMMIES PO) Take 2 tablets by mouth in the morning.   Yes [provider]  Calcium-Phosphorus-Vitamin D (  CALCIUM GUMMIES PO) Take 2 tablets by mouth in the morning.   Yes [provider]  carvedilol (COREG) 25 MG tablet 25 mg 2 (two) times daily with a meal. 02/03/23  Yes [provider]  celecoxib (CELEBREX) 200 MG capsule Take 200 mg by mouth daily with breakfast.   Yes [provider]  Coenzyme Q10 (COQ-10 PO) Take 200 mg by mouth in the morning.   Yes [provider]  CRANBERRY PO Take 2 capsules by mouth  in the morning. 25250 mg each   Yes [provider]  diltiazem (CARDIZEM CD) 240 MG 24 hr capsule Take 240 mg by mouth daily with breakfast.   Yes [provider]  FLOVENT HFA 110 MCG/ACT inhaler Inhale 1 puff into the lungs in the morning and at bedtime. 06/05/12  Yes [provider]  Glucosamine-Chondroitin (OSTEO BI-FLEX REGULAR STRENGTH PO) Take 1 tablet by mouth in the morning.   Yes [provider]  Lactobacillus-Inulin (CULTURELLE DIGESTIVE HEALTH PO) Take 1 tablet by mouth daily as needed (digestive aid).   Yes [provider]  loratadine (CLARITIN) 10 MG tablet Take 10 mg by mouth in the morning.   Yes [provider]  Lutein 40 MG CAPS Take 40 mg by mouth daily.   Yes [provider]  Metamucil Fiber CHEW Chew 1-2 tablets by mouth daily as needed (constipation.). Gummy   Yes [provider]  omeprazole (PRILOSEC) 20 MG capsule Take 20 mg by mouth daily before breakfast. 07/07/12  Yes [provider]  OVER THE COUNTER MEDICATION Take 0.5 tablets by mouth at bedtime as needed (Sleep). Equilibrium Comfort Care Blend (Cannabichromene (CBC), Cannabidiol (CBD), Cananbigerol (CBG), Delta 9 Tetrahydrocannabinol (THC), and Cannabinol (CBN)   Yes [provider]  Pediatric Multivit-Minerals (FLINTSTONES COMPLETE PO) Take 1 tablet by mouth daily.   Yes [provider]  traMADol (ULTRAM) 50 MG tablet Take 50 mg by mouth daily as needed (knee pain.).   Yes [provider]  Vibegron (GEMTESA) 75 MG TABS Take 75 mg by mouth at bedtime.   Yes [provider]   Social History   Socioeconomic History   Marital status: Married    Spouse name: Not on file   Number of children: Not on file   Years of education: Not on file   Highest education level: Not on file  Occupational History   Not on file  Tobacco Use   Smoking status: Never   Smokeless tobacco: Never  Vaping Use   Vaping Use:  Never used  Substance and Sexual Activity   Alcohol use: Not Currently    Comment: rare   Drug use: Never   Sexual activity: Not on file  Other Topics Concern   Not on file  Social History Narrative   Not on file   Social Determinants of Health   Financial Resource Strain: Not on file  Food Insecurity: Not on file  Transportation Needs: Not on file  Physical Activity: Not on file  Stress: Not on file  Social Connections: Not on file   No family history on file.  ROS: Currently denies lightheadedness, dizziness, Fever, chills, CP, SOB.   No personal history of DVT, PE, MI, or CVA. No loose teeth or dentures All other systems have been reviewed and were otherwise currently negative with the exception of those mentioned in the HPI and as above.  Objective: Vitals: Height 5 feet 6 inches, weight 160.2 pounds.  Temperature 97.8, BP 180/83, pulse  60, oxygen saturation 98% on room air.   Physical Exam: General: Alert, NAD.  Antalgic Gait  HEENT: EOMI, Good Neck Extension  Pulm: No increased work of breathing.  Clear B/L A/P w/o crackle or wheeze.  CV: RRR, No m/g/r appreciated  GI: soft, NT, ND Neuro: Neuro without gross focal deficit.  Sensation intact distally Skin: No lesions in the area of chief complaint MSK/Surgical Site: right knee w/o redness or effusion. She has 0 to 110 degrees of range of motion of the right knee.  No medial or lateral joint line tenderness.  Positive crepitus.  Dorsiflexion and plantarflexion intact in the ankle.  Distal sensation is intact.  She has 2+ PT pulse.    Imaging Review Plain radiographs demonstrate severe degenerative joint disease of the right knee, especially in the patellofemoral joint  The overall alignment isneutral. The bone quality appears to be adequate for age and reported activity level.  Preoperative templating of the joint replacement has been completed, documented, and submitted to the Operating Room personnel in order to  optimize intra-operative equipment management.  Assessment: right knee DJD  Plan: Plan for Procedure(s): TOTAL KNEE ARTHROPLASTY  The patient history, physical exam, clinical judgement of the provider and imaging are consistent with end stage degenerative joint disease and total joint arthroplasty is deemed medically necessary. The treatment options including medical management, injection therapy, and arthroplasty were discussed at length. The risks and benefits of Procedure(s): TOTAL KNEE ARTHROPLASTY were presented and reviewed.  The risks of nonoperative treatment, versus surgical intervention including but not limited to continued pain, aseptic loosening, stiffness, dislocation/subluxation, infection, bleeding, nerve injury, blood clots, cardiopulmonary complications, morbidity, mortality, among others were discussed. The patient verbalizes understanding and wishes to proceed with the plan.  Patient is being admitted for inpatient treatment for surgery, pain control, PT, prophylactic antibiotics, VTE prophylaxis, progressive ambulation, ADL's and discharge planning.   Dental prophylaxis discussed and recommended for 1 years postoperatively.  The patient does meet the criteria for TXA which will be used perioperatively.   ASA 81 mg BID will be used postoperatively for DVT prophylaxis in addition to SCDs, and early ambulation. The patient is planning to be discharged home with OPPT in care of family   Annita Brod 03/24/2023 9:31 AM

## 2023-03-25 ENCOUNTER — Other Ambulatory Visit: Payer: Self-pay

## 2023-03-25 ENCOUNTER — Encounter (HOSPITAL_COMMUNITY): Payer: Self-pay | Admitting: Orthopedic Surgery

## 2023-03-25 ENCOUNTER — Ambulatory Visit (HOSPITAL_COMMUNITY): Payer: Medicare Other

## 2023-03-25 ENCOUNTER — Ambulatory Visit (HOSPITAL_COMMUNITY)
Admission: RE | Admit: 2023-03-25 | Discharge: 2023-03-25 | Disposition: A | Discharge status: Home / Self Care | Payer: Medicare Other | Source: Ambulatory Visit | Attending: Orthopedic Surgery | Admitting: Orthopedic Surgery

## 2023-03-25 ENCOUNTER — Ambulatory Visit (HOSPITAL_BASED_OUTPATIENT_CLINIC_OR_DEPARTMENT_OTHER): Payer: Medicare Other | Admitting: Anesthesiology

## 2023-03-25 ENCOUNTER — Encounter (HOSPITAL_COMMUNITY)
Admission: RE | Disposition: A | Discharge status: Home / Self Care | Payer: Self-pay | Source: Ambulatory Visit | Attending: Orthopedic Surgery

## 2023-03-25 ENCOUNTER — Ambulatory Visit (HOSPITAL_COMMUNITY): Payer: Medicare Other | Admitting: Emergency Medicine

## 2023-03-25 DIAGNOSIS — J45909 Unspecified asthma, uncomplicated: Secondary | ICD-10-CM | POA: Diagnosis not present

## 2023-03-25 DIAGNOSIS — M1711 Unilateral primary osteoarthritis, right knee: Secondary | ICD-10-CM | POA: Insufficient documentation

## 2023-03-25 DIAGNOSIS — K219 Gastro-esophageal reflux disease without esophagitis: Secondary | ICD-10-CM | POA: Diagnosis not present

## 2023-03-25 DIAGNOSIS — I10 Essential (primary) hypertension: Secondary | ICD-10-CM | POA: Insufficient documentation

## 2023-03-25 DIAGNOSIS — I4891 Unspecified atrial fibrillation: Secondary | ICD-10-CM

## 2023-03-25 DIAGNOSIS — M1990 Unspecified osteoarthritis, unspecified site: Secondary | ICD-10-CM

## 2023-03-25 DIAGNOSIS — Z7951 Long term (current) use of inhaled steroids: Secondary | ICD-10-CM | POA: Insufficient documentation

## 2023-03-25 DIAGNOSIS — Z79899 Other long term (current) drug therapy: Secondary | ICD-10-CM | POA: Insufficient documentation

## 2023-03-25 DIAGNOSIS — R7303 Prediabetes: Secondary | ICD-10-CM

## 2023-03-25 HISTORY — PX: TOTAL KNEE ARTHROPLASTY: SHX125

## 2023-03-25 LAB — GLUCOSE, CAPILLARY: Glucose-Capillary: 101 mg/dL — ABNORMAL HIGH (ref 70–99)

## 2023-03-25 SURGERY — ARTHROPLASTY, KNEE, TOTAL
Anesthesia: Spinal | Site: Knee | Laterality: Right

## 2023-03-25 MED ORDER — CEFAZOLIN SODIUM-DEXTROSE 2-4 GM/100ML-% IV SOLN
2.0000 g | INTRAVENOUS | Status: AC
  Administered 2023-03-25: 2 g via INTRAVENOUS

## 2023-03-25 MED ORDER — ORAL CARE MOUTH RINSE: 15.0000 mL | Freq: Once | OROMUCOSAL | Status: AC

## 2023-03-25 MED ORDER — ROPIVACAINE HCL 5 MG/ML IJ SOLN
INTRAMUSCULAR | Status: DC | PRN
  Administered 2023-03-25: 30 mL via PERINEURAL

## 2023-03-25 MED ORDER — PHENYLEPHRINE HCL-NACL 20-0.9 MG/250ML-% IV SOLN
INTRAVENOUS | Status: DC | PRN
  Administered 2023-03-25: 50 ug/min via INTRAVENOUS

## 2023-03-25 MED ORDER — LACTATED RINGERS IV BOLUS: 250.0000 mL | Freq: Once | INTRAVENOUS | Status: DC

## 2023-03-25 MED ORDER — LACTATED RINGERS IV BOLUS
500.0000 mL | Freq: Once | INTRAVENOUS | Status: AC
  Administered 2023-03-25: 500 mL via INTRAVENOUS

## 2023-03-25 MED ORDER — PROPOFOL 10 MG/ML IV BOLUS
INTRAVENOUS | Status: DC | PRN
  Administered 2023-03-25 (×2): 20 mg via INTRAVENOUS
  Administered 2023-03-25: 30 mg via INTRAVENOUS

## 2023-03-25 MED ORDER — WATER FOR IRRIGATION, STERILE IR SOLN
Status: DC | PRN
  Administered 2023-03-25: 2000 mL

## 2023-03-25 MED ORDER — METHOCARBAMOL 500 MG IVPB - SIMPLE MED: 500.0000 mg | Freq: Four times a day (QID) | INTRAVENOUS | Status: DC | PRN

## 2023-03-25 MED ORDER — POVIDONE-IODINE 7.5 % EX SOLN: Freq: Once | CUTANEOUS | Status: DC

## 2023-03-25 MED ORDER — KETOROLAC TROMETHAMINE 30 MG/ML IJ SOLN
INTRAMUSCULAR | Status: DC | PRN
  Administered 2023-03-25: 30 mg via INTRAMUSCULAR

## 2023-03-25 MED ORDER — CEFAZOLIN SODIUM-DEXTROSE 2-4 GM/100ML-% IV SOLN: 2.0000 g | Freq: Four times a day (QID) | INTRAVENOUS | Status: DC

## 2023-03-25 MED ORDER — AMISULPRIDE (ANTIEMETIC) 5 MG/2ML IV SOLN: 10.0000 mg | Freq: Once | INTRAVENOUS | Status: DC | PRN

## 2023-03-25 MED ORDER — ONDANSETRON HCL 4 MG/2ML IJ SOLN
INTRAMUSCULAR | Status: AC
  Filled 2023-03-25: qty 2

## 2023-03-25 MED ORDER — FENTANYL CITRATE (PF) 100 MCG/2ML IJ SOLN
INTRAMUSCULAR | Status: AC
  Filled 2023-03-25: qty 2

## 2023-03-25 MED ORDER — BUPIVACAINE HCL (PF) 0.25 % IJ SOLN
INTRAMUSCULAR | Status: AC
  Filled 2023-03-25: qty 30

## 2023-03-25 MED ORDER — PROPOFOL 10 MG/ML IV BOLUS
INTRAVENOUS | Status: AC
  Filled 2023-03-25: qty 20

## 2023-03-25 MED ORDER — KETOROLAC TROMETHAMINE 30 MG/ML IJ SOLN
INTRAMUSCULAR | Status: AC
  Filled 2023-03-25: qty 1

## 2023-03-25 MED ORDER — ONDANSETRON HCL 4 MG/2ML IJ SOLN
INTRAMUSCULAR | Status: DC | PRN
  Administered 2023-03-25: 4 mg via INTRAVENOUS

## 2023-03-25 MED ORDER — PHENYLEPHRINE HCL (PRESSORS) 10 MG/ML IV SOLN
INTRAVENOUS | Status: AC
  Filled 2023-03-25: qty 1

## 2023-03-25 MED ORDER — POVIDONE-IODINE 10 % EX SWAB: 2.0000 | Freq: Once | CUTANEOUS | Status: DC

## 2023-03-25 MED ORDER — LACTATED RINGERS IV SOLN: INTRAVENOUS | Status: DC

## 2023-03-25 MED ORDER — LACTATED RINGERS IV BOLUS
250.0000 mL | Freq: Once | INTRAVENOUS | Status: AC
  Administered 2023-03-25: 250 mL via INTRAVENOUS

## 2023-03-25 MED ORDER — CHLORHEXIDINE GLUCONATE 0.12 % MT SOLN
15.0000 mL | Freq: Once | OROMUCOSAL | Status: AC
  Administered 2023-03-25: 15 mL via OROMUCOSAL

## 2023-03-25 MED ORDER — DEXAMETHASONE SODIUM PHOSPHATE 10 MG/ML IJ SOLN
INTRAMUSCULAR | Status: DC | PRN
  Administered 2023-03-25: 8 mg via INTRAVENOUS

## 2023-03-25 MED ORDER — LIDOCAINE 2% (20 MG/ML) 5 ML SYRINGE
INTRAMUSCULAR | Status: DC | PRN
  Administered 2023-03-25: 40 mg via INTRAVENOUS

## 2023-03-25 MED ORDER — HYDROCODONE-ACETAMINOPHEN 10-325 MG PO TABS: 1.0000 | ORAL_TABLET | ORAL | 0 refills | Status: DC | PRN

## 2023-03-25 MED ORDER — CEFAZOLIN SODIUM-DEXTROSE 2-4 GM/100ML-% IV SOLN
INTRAVENOUS | Status: AC
  Administered 2023-03-25: 2 g via INTRAVENOUS
  Filled 2023-03-25: qty 100

## 2023-03-25 MED ORDER — HYDROMORPHONE HCL 1 MG/ML IJ SOLN: 0.2500 mg | INTRAMUSCULAR | Status: DC | PRN

## 2023-03-25 MED ORDER — CLONIDINE HCL (ANALGESIA) 100 MCG/ML EP SOLN
EPIDURAL | Status: DC | PRN
  Administered 2023-03-25: 80 ug

## 2023-03-25 MED ORDER — METHOCARBAMOL 500 MG PO TABS: 500.0000 mg | ORAL_TABLET | Freq: Four times a day (QID) | ORAL | Status: DC | PRN

## 2023-03-25 MED ORDER — PROPOFOL 1000 MG/100ML IV EMUL
INTRAVENOUS | Status: AC
  Filled 2023-03-25: qty 100

## 2023-03-25 MED ORDER — TRANEXAMIC ACID-NACL 1000-0.7 MG/100ML-% IV SOLN
INTRAVENOUS | Status: AC
  Filled 2023-03-25: qty 100

## 2023-03-25 MED ORDER — ASPIRIN 81 MG PO TBEC: 81.0000 mg | DELAYED_RELEASE_TABLET | Freq: Two times a day (BID) | ORAL | 0 refills | Status: AC

## 2023-03-25 MED ORDER — LIDOCAINE HCL (PF) 2 % IJ SOLN
INTRAMUSCULAR | Status: AC
  Filled 2023-03-25: qty 5

## 2023-03-25 MED ORDER — HYDROCODONE-ACETAMINOPHEN 7.5-325 MG PO TABS: 1.0000 | ORAL_TABLET | ORAL | Status: DC | PRN

## 2023-03-25 MED ORDER — FENTANYL CITRATE (PF) 100 MCG/2ML IJ SOLN
INTRAMUSCULAR | Status: DC | PRN
  Administered 2023-03-25: 25 ug via INTRAVENOUS
  Administered 2023-03-25: 75 ug via INTRAVENOUS

## 2023-03-25 MED ORDER — DEXAMETHASONE SODIUM PHOSPHATE 4 MG/ML IJ SOLN
INTRAMUSCULAR | Status: DC | PRN
  Administered 2023-03-25: 5 mg via PERINEURAL

## 2023-03-25 MED ORDER — TRANEXAMIC ACID-NACL 1000-0.7 MG/100ML-% IV SOLN
1000.0000 mg | Freq: Once | INTRAVENOUS | Status: AC
  Administered 2023-03-25: 1000 mg via INTRAVENOUS

## 2023-03-25 MED ORDER — DEXAMETHASONE SODIUM PHOSPHATE 10 MG/ML IJ SOLN
INTRAMUSCULAR | Status: AC
  Filled 2023-03-25: qty 1

## 2023-03-25 MED ORDER — ACETAMINOPHEN 500 MG PO TABS: 1000.0000 mg | ORAL_TABLET | Freq: Once | ORAL | Status: DC

## 2023-03-25 MED ORDER — PROPOFOL 500 MG/50ML IV EMUL
INTRAVENOUS | Status: DC | PRN
  Administered 2023-03-25: 50 ug/kg/min via INTRAVENOUS

## 2023-03-25 MED ORDER — SENNA-DOCUSATE SODIUM 8.6-50 MG PO TABS: 2.0000 | ORAL_TABLET | Freq: Every day | ORAL | 1 refills | Status: DC

## 2023-03-25 MED ORDER — ONDANSETRON HCL 4 MG PO TABS: 4.0000 mg | ORAL_TABLET | Freq: Three times a day (TID) | ORAL | 0 refills | Status: DC | PRN

## 2023-03-25 MED ORDER — ACETAMINOPHEN 500 MG PO TABS
1000.0000 mg | ORAL_TABLET | Freq: Once | ORAL | Status: AC
  Administered 2023-03-25: 1000 mg via ORAL

## 2023-03-25 MED ORDER — CEFAZOLIN SODIUM-DEXTROSE 2-4 GM/100ML-% IV SOLN
INTRAVENOUS | Status: AC
  Filled 2023-03-25: qty 100

## 2023-03-25 MED ORDER — HYDROCODONE-ACETAMINOPHEN 5-325 MG PO TABS: 1.0000 | ORAL_TABLET | ORAL | Status: DC | PRN

## 2023-03-25 MED ORDER — BUPIVACAINE IN DEXTROSE 0.75-8.25 % IT SOLN
INTRATHECAL | Status: DC | PRN
  Administered 2023-03-25: 1.8 mL via INTRATHECAL

## 2023-03-25 MED ORDER — TRANEXAMIC ACID-NACL 1000-0.7 MG/100ML-% IV SOLN
1000.0000 mg | INTRAVENOUS | Status: AC
  Administered 2023-03-25: 1000 mg via INTRAVENOUS
  Filled 2023-03-25: qty 100

## 2023-03-25 MED ORDER — SODIUM CHLORIDE 0.9 % IR SOLN
Status: DC | PRN
  Administered 2023-03-25: 1000 mL

## 2023-03-25 MED ORDER — BUPIVACAINE HCL 0.25 % IJ SOLN
INTRAMUSCULAR | Status: DC | PRN
  Administered 2023-03-25: 30 mL

## 2023-03-25 SURGICAL SUPPLY — 57 items
ATTUNE MED DOME PAT 38 KNEE (Knees) IMPLANT
ATTUNE PS FEM RT SZ 5 CEM KNEE (Femur) IMPLANT
BAG COUNTER SPONGE SURGICOUNT (BAG) IMPLANT
BAG SPEC THK2 15X12 ZIP CLS (MISCELLANEOUS)
BAG SPNG CNTER NS LX DISP (BAG) ×1
BAG ZIPLOCK 12X15 (MISCELLANEOUS) IMPLANT
BASEPLATE TIB CMT FB PCKT SZ5 (Knees) IMPLANT
BLADE SAG 18X100X1.27 (BLADE) ×1 IMPLANT
BLADE SAW SGTL 11.0X1.19X90.0M (BLADE) IMPLANT
BLADE SAW SGTL 13X75X1.27 (BLADE) ×1 IMPLANT
BLADE SURG 15 STRL LF DISP TIS (BLADE) ×1 IMPLANT
BLADE SURG 15 STRL SS (BLADE) ×1
BNDG CMPR MED 10X6 ELC LF (GAUZE/BANDAGES/DRESSINGS) ×1
BNDG ELASTIC 6X10 VLCR STRL LF (GAUZE/BANDAGES/DRESSINGS) ×1 IMPLANT
BOWL SMART MIX CTS (DISPOSABLE) ×1 IMPLANT
BSPLAT TIB 5 CMNT FXBRNG STRL (Knees) ×1 IMPLANT
CEMENT HV SMART SET (Cement) ×2 IMPLANT
CLSR STERI-STRIP ANTIMIC 1/2X4 (GAUZE/BANDAGES/DRESSINGS) ×2 IMPLANT
COVER SURGICAL LIGHT HANDLE (MISCELLANEOUS) ×1 IMPLANT
CUFF TOURN SGL QUICK 34 (TOURNIQUET CUFF) ×1
CUFF TRNQT CYL 34X4.125X (TOURNIQUET CUFF) ×1 IMPLANT
DRAPE SHEET LG 3/4 BI-LAMINATE (DRAPES) ×1 IMPLANT
DRAPE U-SHAPE 47X51 STRL (DRAPES) ×1 IMPLANT
DRSG MEPILEX POST OP 4X12 (GAUZE/BANDAGES/DRESSINGS) ×1 IMPLANT
DRSG MEPILEX POST OP 4X8 (GAUZE/BANDAGES/DRESSINGS) IMPLANT
DURAPREP 26ML APPLICATOR (WOUND CARE) ×2 IMPLANT
ELECT REM PT RETURN 15FT ADLT (MISCELLANEOUS) ×1 IMPLANT
GAUZE PAD ABD 7.5X8 STRL (GAUZE/BANDAGES/DRESSINGS) IMPLANT
GAUZE PAD ABD 8X10 STRL (GAUZE/BANDAGES/DRESSINGS) ×2 IMPLANT
GLOVE BIO SURGEON STRL SZ 6.5 (GLOVE) ×1 IMPLANT
GLOVE BIO SURGEON STRL SZ7.5 (GLOVE) ×1 IMPLANT
GLOVE BIOGEL PI IND STRL 7.0 (GLOVE) ×1 IMPLANT
GLOVE BIOGEL PI IND STRL 8 (GLOVE) ×1 IMPLANT
GOWN STRL SURGICAL XL XLNG (GOWN DISPOSABLE) ×2 IMPLANT
HANDPIECE INTERPULSE COAX TIP (DISPOSABLE) ×1
HOLDER FOLEY CATH W/STRAP (MISCELLANEOUS) IMPLANT
IMMOBILIZER KNEE 20 (SOFTGOODS) ×1
IMMOBILIZER KNEE 20 THIGH 36 (SOFTGOODS) ×1 IMPLANT
INSERT TIB FIX BEARNG SZ 5 5MM (Insert) IMPLANT
KIT TURNOVER KIT A (KITS) IMPLANT
MANIFOLD NEPTUNE II (INSTRUMENTS) ×1 IMPLANT
NS IRRIG 1000ML POUR BTL (IV SOLUTION) ×1 IMPLANT
PACK TOTAL KNEE CUSTOM (KITS) ×1 IMPLANT
PIN STEINMAN FIXATION KNEE (PIN) IMPLANT
PROTECTOR NERVE ULNAR (MISCELLANEOUS) ×1 IMPLANT
SET HNDPC FAN SPRY TIP SCT (DISPOSABLE) ×1 IMPLANT
SET PAD KNEE POSITIONER (MISCELLANEOUS) ×1 IMPLANT
SPIKE FLUID TRANSFER (MISCELLANEOUS) IMPLANT
SUT VIC AB 1 CT1 36 (SUTURE) ×2 IMPLANT
SUT VIC AB 2-0 CT1 27 (SUTURE) ×1
SUT VIC AB 2-0 CT1 TAPERPNT 27 (SUTURE) ×1 IMPLANT
SUT VIC AB 3-0 SH 27 (SUTURE)
SUT VIC AB 3-0 SH 27X BRD (SUTURE) IMPLANT
TRAY FOLEY MTR SLVR 16FR STAT (SET/KITS/TRAYS/PACK) ×1 IMPLANT
TUBE SUCTION HIGH CAP CLEAR NV (SUCTIONS) ×1 IMPLANT
WATER STERILE IRR 1000ML POUR (IV SOLUTION) ×2 IMPLANT
WRAP KNEE MAXI GEL POST OP (GAUZE/BANDAGES/DRESSINGS) ×1 IMPLANT

## 2023-03-25 NOTE — Anesthesia Procedure Notes (Signed)
Spinal  Patient location during procedure: OR Start time: 03/25/2023 7:28 AM End time: 03/25/2023 7:34 AM Reason for block: surgical anesthesia Staffing Performed: anesthesiologist  Anesthesiologist: Lewie Loron, MD Performed by: Lewie Loron, MD Authorized by: Lewie Loron, MD   Preanesthetic Checklist Completed: patient identified, IV checked, site marked, risks and benefits discussed, surgical consent, monitors and equipment checked, pre-op evaluation and timeout performed Spinal Block Patient position: sitting Prep: DuraPrep and site prepped and draped Patient monitoring: heart rate, continuous pulse ox and blood pressure Approach: right paramedian Location: L4-5 Injection technique: single-shot Needle Needle type: Spinocan  Needle gauge: 25 G Needle length: 9 cm Additional Notes Expiration date of kit checked and confirmed. Patient tolerated procedure well, without complications.

## 2023-03-25 NOTE — Anesthesia Procedure Notes (Signed)
Anesthesia Regional Block: Adductor canal block   Pre-Anesthetic Checklist: , timeout performed,  Correct Patient, Correct Site, Correct Laterality,  Correct Procedure, Correct Position, site marked,  Risks and benefits discussed,  Surgical consent,  Pre-op evaluation,  At surgeon's request and post-op pain management  Laterality: Lower and Right  Prep: chloraprep       Needles:  Injection technique: Single-shot  Needle Type: Stimiplex     Needle Length: 9cm  Needle Gauge: 21     Additional Needles:   Procedures:,,,, ultrasound used (permanent image in chart),,    Narrative:  Start time: 03/25/2023 6:49 AM End time: 03/25/2023 7:09 AM Injection made incrementally with aspirations every 5 mL.  Performed by: Personally  Anesthesiologist: Lewie Loron, MD  Additional Notes: BP cuff, EKG monitors applied. Sedation begun. Artery and nerve location verified with ultrasound. Anesthetic injected incrementally (23ml), slowly, and after negative aspirations under direct u/s guidance. Good fascial/perineural spread. Tolerated well.

## 2023-03-25 NOTE — Transfer of Care (Signed)
Immediate Anesthesia Transfer of Care Note  Patient: Sharon Wang  Procedure(s) Performed: TOTAL KNEE ARTHROPLASTY (Right: Knee)  Patient Location: PACU  Anesthesia Type:Spinal  Level of Consciousness: awake and alert   Airway & Oxygen Therapy: Patient Spontanous Breathing and Patient connected to face mask oxygen  Post-op Assessment: Report given to RN and Post -op Vital signs reviewed and stable  Post vital signs: Reviewed and stable  Last Vitals:  Vitals Value Taken Time  BP 111/61 03/25/23 1000  Temp    Pulse 63 03/25/23 1001  Resp 13 03/25/23 1001  SpO2 100 % 03/25/23 1001  Vitals shown include unvalidated device data.  Last Pain:  Vitals:   03/25/23 0633  TempSrc:   PainSc: 0-No pain         Complications: No notable events documented.

## 2023-03-25 NOTE — Discharge Instructions (Signed)
INSTRUCTIONS AFTER JOINT REPLACEMENT   Remove items at home which could result in a fall. This includes throw rugs or furniture in walking pathways ICE to the affected joint every three hours while awake for 30 minutes at a time, for at least the first 3-5 days, and then as needed for pain and swelling.  Continue to use ice for pain and swelling. You may notice swelling that will progress down to the foot and ankle.  This is normal after surgery.  Elevate your leg when you are not up walking on it.   Continue to use the breathing machine you got in the hospital (incentive spirometer) which will help keep your temperature down.  It is common for your temperature to cycle up and down following surgery, especially at night when you are not up moving around and exerting yourself.  The breathing machine keeps your lungs expanded and your temperature down.   DIET:  As you were doing prior to hospitalization, we recommend a well-balanced diet.  DRESSING / WOUND CARE / SHOWERING  You may shower 3 days after surgery, but keep the wounds dry during showering.  You may use an occlusive plastic wrap (Press'n Seal for example), NO SOAKING/SUBMERGING IN THE BATHTUB.  If the bandage gets wet, change with a clean dry gauze.  If the incision gets wet, pat the wound dry with a clean towel. Take large ace wrap off of your leg 24 hours after surgery. Please keep sticky bandage on until follow up visit, please call our office if you see significant drainage coming through the sticky bandage. After the ace wrap comes off, please wear your compression stockings during the day until your follow up visit. You may remove them at night while sleeping.   ACTIVITY  Increase activity slowly as tolerated, but follow the weight bearing instructions below.   No driving for 6 weeks or until further direction given by your physician.  You cannot drive while taking narcotics.  No lifting or carrying greater than 10 lbs. until further  directed by your surgeon. Avoid periods of inactivity such as sitting longer than an hour when not asleep. This helps prevent blood clots.  You may return to work once you are authorized by your doctor.     WEIGHT BEARING   Weight bearing as tolerated with assist device (walker, cane, etc) as directed, use it as long as suggested by your surgeon or therapist, typically at least 4-6 weeks.   EXERCISES  Results after joint replacement surgery are often greatly improved when you follow the exercise, range of motion and muscle strengthening exercises prescribed by your doctor. Safety measures are also important to protect the joint from further injury. Any time any of these exercises cause you to have increased pain or swelling, decrease what you are doing until you are comfortable again and then slowly increase them. If you have problems or questions, call your caregiver or physical therapist for advice.   Rehabilitation is important following a joint replacement. After just a few days of immobilization, the muscles of the leg can become weakened and shrink (atrophy).  These exercises are designed to build up the tone and strength of the thigh and leg muscles and to improve motion. Often times heat used for twenty to thirty minutes before working out will loosen up your tissues and help with improving the range of motion but do not use heat for the first two weeks following surgery (sometimes heat can increase post-operative swelling).  These exercises can be done on a training (exercise) mat, on the floor, on a table or on a bed. Use whatever works the best and is most comfortable for you.    Use music or television while you are exercising so that the exercises are a pleasant break in your day. This will make your life better with the exercises acting as a break in your routine that you can look forward to.   Perform all exercises about fifteen times, three times per day or as directed.  You should  exercise both the operative leg and the other leg as well.  Exercises include:   Quad Sets - Tighten up the muscle on the front of the thigh (Quad) and hold for 5-10 seconds.   Straight Leg Raises - With your knee straight (if you were given a brace, keep it on), lift the leg to 60 degrees, hold for 3 seconds, and slowly lower the leg.  Perform this exercise against resistance later as your leg gets stronger.  Leg Slides: Lying on your back, slowly slide your foot toward your buttocks, bending your knee up off the floor (only go as far as is comfortable). Then slowly slide your foot back down until your leg is flat on the floor again.  Angel Wings: Lying on your back spread your legs to the side as far apart as you can without causing discomfort.  Hamstring Strength:  Lying on your back, push your heel against the floor with your leg straight by tightening up the muscles of your buttocks.  Repeat, but this time bend your knee to a comfortable angle, and push your heel against the floor.  You may put a pillow under the heel to make it more comfortable if necessary.   A rehabilitation program following joint replacement surgery can speed recovery and prevent re-injury in the future due to weakened muscles. Contact your doctor or a physical therapist for more information on knee rehabilitation.    CONSTIPATION  Constipation is defined medically as fewer than three stools per week and severe constipation as less than one stool per week.  Even if you have a regular bowel pattern at home, your normal regimen is likely to be disrupted due to multiple reasons following surgery.  Combination of anesthesia, postoperative narcotics, change in appetite and fluid intake all can affect your bowels.   YOU MUST use at least one of the following options; they are listed in order of increasing strength to get the job done.  They are all available over the counter, and you may need to use some, POSSIBLY even all of  these options:    Drink plenty of fluids (prune juice may be helpful) and high fiber foods Colace 100 mg by mouth twice a day  Senokot for constipation as directed and as needed Dulcolax (bisacodyl), take with full glass of water  Miralax (polyethylene glycol) once or twice a day as needed.  If you have tried all these things and are unable to have a bowel movement in the first 3-4 days after surgery call either your surgeon or your primary doctor.    If you experience loose stools or diarrhea, hold the medications until you stool forms back up.  If your symptoms do not get better within 1 week or if they get worse, check with your doctor.  If you experience "the worst abdominal pain ever" or develop nausea or vomiting, please contact the office immediately for further recommendations for treatment.  ITCHING:  If you experience itching with your medications, try taking only a single pain pill, or even half a pain pill at a time.  You can also use Benadryl over the counter for itching or also to help with sleep.   TED HOSE STOCKINGS:  Use stockings on both legs until for at least 2 weeks or as directed by physician office. They may be removed at night for sleeping.  MEDICATIONS:  See your medication summary on the "After Visit Summary" that nursing will review with you.  You may have some home medications which will be placed on hold until you complete the course of blood thinner medication.  It is important for you to complete the blood thinner medication as prescribed.  PRECAUTIONS:  If you experience chest pain or shortness of breath - call 911 immediately for transfer to the hospital emergency department.   If you develop a fever greater that 101 F, purulent drainage from wound, increased redness or drainage from wound, foul odor from the wound/dressing, or calf pain - CONTACT YOUR SURGEON.                                                   FOLLOW-UP APPOINTMENTS:  If you do not already have  a post-op appointment, please call the office for an appointment to be seen by your surgeon.  Guidelines for how soon to be seen are listed in your "After Visit Summary", but are typically between 1-4 weeks after surgery.  OTHER INSTRUCTIONS:   Knee Replacement:  Do not place pillow under knee, focus on keeping the knee straight while resting.    POST-OPERATIVE OPIOID TAPER INSTRUCTIONS: It is important to wean off of your opioid medication as soon as possible. If you do not need pain medication after your surgery it is ok to stop day one. Opioids include: Codeine, Hydrocodone(Norco, Vicodin), Oxycodone(Percocet, oxycontin) and hydromorphone amongst others.  Long term and even short term use of opiods can cause: Increased pain response Dependence Constipation Depression Respiratory depression And more.  Withdrawal symptoms can include Flu like symptoms Nausea, vomiting And more Techniques to manage these symptoms Hydrate well Eat regular healthy meals Stay active Use relaxation techniques(deep breathing, meditating, yoga) Do Not substitute Alcohol to help with tapering If you have been on opioids for less than two weeks and do not have pain than it is ok to stop all together.  Plan to wean off of opioids This plan should start within one week post op of your joint replacement. Maintain the same interval or time between taking each dose and first decrease the dose.  Cut the total daily intake of opioids by one tablet each day Next start to increase the time between doses. The last dose that should be eliminated is the evening dose.   MAKE SURE YOU:  Understand these instructions.  Get help right away if you are not doing well or get worse.    Thank you for letting us be a part of your medical care team.  It is a privilege we respect greatly.  We hope these instructions will help you stay on track for a fast and full recovery!

## 2023-03-25 NOTE — Interval H&P Note (Signed)
History and Physical Interval Note:  03/25/2023 7:14 AM  Sharon Wang  has presented today for surgery, with the diagnosis of right knee DJD.  The various methods of treatment have been discussed with the patient and family. After consideration of risks, benefits and other options for treatment, the patient has consented to  Procedure(s): TOTAL KNEE ARTHROPLASTY (Right) as a surgical intervention.  The patient's history has been reviewed, patient examined, no change in status, stable for surgery.  I have reviewed the patient's chart and labs.  Questions were answered to the patient's satisfaction.     Eulas Post

## 2023-03-25 NOTE — Anesthesia Postprocedure Evaluation (Signed)
Anesthesia Post Note  Patient: Sharon Wang  Procedure(s) Performed: TOTAL KNEE ARTHROPLASTY (Right: Knee)     Patient location during evaluation: PACU Anesthesia Type: Spinal Level of consciousness: awake and alert Pain management: pain level controlled Vital Signs Assessment: post-procedure vital signs reviewed and stable Respiratory status: spontaneous breathing Cardiovascular status: stable Anesthetic complications: no   No notable events documented.  Last Vitals:  Vitals:   03/25/23 1015 03/25/23 1030  BP: 122/72 (!) 141/70  Pulse: 63 (!) 59  Resp: 12 18  Temp:    SpO2: 96% 98%    Last Pain:  Vitals:   03/25/23 1030  TempSrc:   PainSc: 0-No pain    LLE Motor Response: Purposeful movement (03/25/23 1030) LLE Sensation: Decreased;Numbness (03/25/23 1030) RLE Motor Response: Purposeful movement (03/25/23 1030) RLE Sensation: Decreased;Numbness (03/25/23 1030) L Sensory Level: S3-Medial thigh (03/25/23 1030) R Sensory Level: S3-Medial thigh (03/25/23 1030)  Lewie Loron

## 2023-03-25 NOTE — Evaluation (Signed)
Physical Therapy Evaluation Patient Details Name: Sharon Wang MRN: [id number] DOB: 06-05-1946 Today's Date: 03/25/2023  History of Present Illness  77 yo female presents to therapy s/p R TKA on 03/25/2023 due to failure of conservative measures. Pt has PMH of asthma, GERD, HTN and hx of PNA.  Clinical Impression    Sharon Wang is a 77 y.o. female POD 0 s/p R TKA. Patient reports IND with mobility at baseline. Patient is now limited by functional impairments (see PT problem list below) and requires min gaurd for transfers and gait with RW. Patient was able to ambulate 50 and 45 feet with RW and min guard and cues for safe walker management. Patient educated on safe sequencing for stair mobility, pain management, car transfer technique, fall risk prevention pt and husband demonstrated verbal understanding for safe guarding position for people assisting with mobility. Patient instructed in exercises to facilitate ROM and circulation reviewed with HO provided. Patient will benefit from continued skilled PT interventions to address impairments and progress towards PLOF. Patient has met mobility goals at adequate level for discharge home with family support and OPPT; will continue to follow if pt continues acute stay to progress towards Mod I goals.      Recommendations for follow up therapy are one component of a multi-disciplinary discharge planning process, led by the attending physician.  Recommendations may be updated based on patient status, additional functional criteria and insurance authorization.  Follow Up Recommendations       Assistance Recommended at Discharge Intermittent Supervision/Assistance  Patient can return home with the following  A little help with walking and/or transfers;A little help with bathing/dressing/bathroom;Assistance with cooking/housework;Assist for transportation;Help with stairs or ramp for entrance    Equipment Recommendations Other (comment) (pt reports DME in  home setting)  Recommendations for Other Services       Functional Status Assessment Patient has had a recent decline in their functional status and demonstrates the ability to make significant improvements in function in a reasonable and predictable amount of time.     Precautions / Restrictions Precautions Precautions: Fall;Knee Restrictions Weight Bearing Restrictions: No      Mobility  Bed Mobility Overal bed mobility: Needs Assistance Bed Mobility: Supine to Sit     Supine to sit: Min guard     General bed mobility comments: cues for safety and sequencing    Transfers Overall transfer level: Needs assistance Equipment used: Rolling walker (2 wheels) Transfers: Sit to/from Stand Sit to Stand: Min guard           General transfer comment: cues for proper UE and AD placement to bed, recliner and commode    Ambulation/Gait Ambulation/Gait assistance: Min guard Gait Distance (Feet): 50 Feet Assistive device: Rolling walker (2 wheels) Gait Pattern/deviations: Step-to pattern Gait velocity: decreased        Stairs Stairs: Yes Stairs assistance: Min guard Stair Management: No rails (HHA) Number of Stairs: 2 General stair comments: 2 steps iwth min guard and B handrail, progressed to 2 steps and B UE support on R handrail and HHA to emualte entering home from garage  Wheelchair Mobility    Modified Rankin (Stroke Patients Only)       Balance Overall balance assessment: Needs assistance Sitting-balance support: Single extremity supported Sitting balance-Leahy Scale: Good     Standing balance support: Bilateral upper extremity supported, During functional activity, Reliant on assistive device for balance Standing balance-Leahy Scale: Poor  Pertinent Vitals/Pain Pain Assessment Pain Assessment: 0-10 Pain Score: 3  Pain Location: R knee (no pain at rest) Pain Descriptors / Indicators: Discomfort,  Dull Pain Intervention(s): Limited activity within patient's tolerance, Monitored during session, Premedicated before session, Repositioned, Ice applied    Home Living Family/patient expects to be discharged to:: Private residence Living Arrangements: Spouse/significant other Available Help at Discharge: Family Type of Home: House Home Access: Stairs to enter Entrance Stairs-Rails: None Entrance Stairs-Number of Steps: 2 Alternate Level Stairs-Number of Steps: 13 Home Layout: Two level Home Equipment: Grab bars - tub/shower;Rolling Walker (2 wheels) Additional Comments: toilet risers and iceman machine    Prior Function Prior Level of Function : Independent/Modified Independent;Driving             Mobility Comments: IND with all ADLs, self care tasks IADLs and driving (pt states has not has to use SPC with rubber tip on base in a few weeks)       Hand Dominance        Extremity/Trunk Assessment        Lower Extremity Assessment Lower Extremity Assessment: RLE deficits/detail RLE Deficits / Details: ankle DF/PF 5/5; SLR , 10 degree lag RLE Sensation: decreased light touch (pt indicated that her groin remained numb t/o PT eval)    Cervical / Trunk Assessment Cervical / Trunk Assessment: Normal  Communication   Communication: No difficulties  Cognition Arousal/Alertness: Awake/alert Behavior During Therapy: WFL for tasks assessed/performed Overall Cognitive Status: Within Functional Limits for tasks assessed                                          General Comments      Exercises Total Joint Exercises Ankle Circles/Pumps: AROM, Both, 20 reps Quad Sets: AROM, Right, 5 reps Heel Slides: AROM, Right, 5 reps Hip ABduction/ADduction: AROM, Right, 5 reps Straight Leg Raises: AROM, Right, 5 reps Long Arc Quad: AROM, Right, 5 reps   Assessment/Plan    PT Assessment Patient needs continued PT services  PT Problem List Decreased  strength;Decreased range of motion;Decreased activity tolerance;Decreased balance;Decreased mobility;Pain       PT Treatment Interventions DME instruction;Gait training;Stair training;Functional mobility training;Therapeutic activities;Therapeutic exercise;Balance training;Neuromuscular re-education;Patient/family education;Modalities    PT Goals (Current goals can be found in the Care Plan section)  Acute Rehab PT Goals Patient Stated Goal: [place] be [place] PT Goal Formulation: With patient Time For Goal Achievement: 04/08/23 Potential to Achieve Goals: Good    Frequency 7X/week     Co-evaluation               AM-PAC PT "6 Clicks" Mobility  Outcome Measure Help needed turning from your back to your side while in a flat bed without using bedrails?: None Help needed moving from lying on your back to sitting on the side of a flat bed without using bedrails?: A Little Help needed moving to and from a bed to a chair (including a wheelchair)?: A Little Help needed standing up from a chair using your arms (e.g., wheelchair or bedside chair)?: A Little Help needed to walk in hospital room?: A Little Help needed climbing 3-5 steps with a railing? : A Little 6 Click Score: 19    End of Session Equipment Utilized During Treatment: Gait belt Activity Tolerance: Patient tolerated treatment well Patient left: in chair;with call bell/phone within reach;with family/visitor  present Nurse Communication: Mobility status;Other (comment) (progression toward d/c) PT Visit Diagnosis: Unsteadiness on feet (R26.81);Other abnormalities of gait and mobility (R26.89);Muscle weakness (generalized) (M62.81);Difficulty in walking, not elsewhere classified (R26.2);Pain Pain - Right/Left: Right Pain - part of body: Knee    Time: 5597-4163 PT Time Calculation (min) (ACUTE ONLY): 50 min   Charges:   PT Evaluation $PT Eval Low Complexity: 1 Low PT Treatments $Gait Training:  8-22 mins $Therapeutic Exercise: 8-22 mins        Rica Mote, PT   Jacqualyn Posey 03/25/2023, 2:08 PM

## 2023-03-25 NOTE — Op Note (Signed)
DATE OF SURGERY:  03/25/2023 TIME: 9:22 AM  PATIENT NAME:  Sharon Wang   AGE: 77 y.o.    PRE-OPERATIVE DIAGNOSIS: Right knee primary localized osteoarthritis  POST-OPERATIVE DIAGNOSIS:  Same  PROCEDURE: RIGHT total Knee Arthroplasty  SURGEON:  Eulas Post, MD   ASSISTANT:  Janine Ores, PA-C, present and scrubbed throughout the case, critical for assistance with exposure, retraction, instrumentation, and closure.   OPERATIVE IMPLANTS: Depuy Attune size 5 posterior Stabilized Femur, with a size 5 fixed Bearing Tibia, 5 polyethylene insert with a 38 medialized oval dome polyethylene patella.  PREOPERATIVE INDICATIONS:  Fritzi Grinde is a 77 y.o. year old female with end stage bone on bone degenerative arthritis of the knee who failed conservative treatment, including injections, antiinflammatories, activity modification, and assistive devices, and had significant impairment of their activities of daily living, and elected for Total Knee Arthroplasty.   The risks, benefits, and alternatives were discussed at length including but not limited to the risks of infection, bleeding, nerve injury, stiffness, blood clots, the need for revision surgery, cardiopulmonary complications, among others, and they were willing to proceed.  OPERATIVE FINDINGS AND UNIQUE ASPECTS OF THE CASE: She had extreme wear on the patella, with severe eburnation and bone loss on the lateral facet.  The entirety of the femoral trochlea and lateral flange had significant degenerative changes, grade 4 eburnation with grooving.  The lateral compartment also had grade 4 changes in the medial compartment had some punctate areas of grade 4 changes as well.  The patella measured 19 mm before the cut and 14 mm after.  It tracked well at the completion of the case.  I did cut the femur twice.  ESTIMATED BLOOD LOSS: 150 mL  OPERATIVE DESCRIPTION:  The patient was brought to the operative room and placed in a supine position.   Anesthesia was administered.  IV antibiotics were given.  The lower extremity was prepped and draped in the usual sterile fashion.  Time out was performed.  The leg was elevated and exsanguinated and the tourniquet was inflated.  Anterior quadriceps tendon splitting approach was performed.  The patella was everted and osteophytes were removed.  The anterior horn of the medial and lateral meniscus was removed.   The patella was then measured, and cut with the saw.  The thickness before the cut was 19 and after the cut was 14.  The lateral facet was not really resected, as there was not enough bone, and I used cement to support the implant.  A metal shield was used to protect the patella throughout the case.    The distal femur was opened with the drill and the intramedullary distal femoral cutting jig was utilized, set at 5 degrees resecting 9 mm off the distal femur.  Care was taken to protect the collateral ligaments.  Then the extramedullary tibial cutting jig was utilized making the appropriate cut using the anterior tibial crest as a reference building in appropriate posterior slope.  Care was taken during the cut to protect the medial and collateral ligaments.  The proximal tibia was removed along with the posterior horns of the menisci.  The PCL was sacrificed.    The extensor gap was measured and found to have adequate resection, measuring to a size 5.  I did cut the femur twice to get adequate distance.  The distal femoral sizing jig was applied, taking care to avoid notching.  This was set at 3 degrees of external rotation.  Then the 4-in-1 cutting  jig was applied and the anterior and posterior femur was cut, along with the chamfer cuts.  All posterior osteophytes were removed.  The flexion gap was then measured and was symmetric with the extension gap.  I completed the distal femoral preparation using the appropriate jig to prepare the box.  The proximal tibia sized and prepared accordingly  with the reamer and the punch, and then all components were trialed with the poly insert.  The knee was found to have excellent balance and full motion.    The above named components were then cemented into place and all excess cement was removed.  The real polyethylene implant was placed.  After the cement had cured I released the tourniquet and confirmed excellent hemostasis with no major posterior vessel injury.    The knee was easily taken through a range of motion and the patella tracked well and the knee irrigated copiously and the parapatellar and subcutaneous tissue closed with vicryl, and monocryl with steri strips for the skin.  The wounds were injected with marcaine, and dressed with sterile gauze and the patient was awakened and returned to the PACU in stable and satisfactory condition.  There were no complications.  Total tourniquet time was approximately 75 minutes.

## 2023-03-26 ENCOUNTER — Encounter (HOSPITAL_COMMUNITY): Payer: Self-pay | Admitting: Orthopedic Surgery

## 2024-09-08 ENCOUNTER — Other Ambulatory Visit: Payer: Self-pay | Admitting: Orthopedic Surgery

## 2024-09-28 ENCOUNTER — Encounter (HOSPITAL_BASED_OUTPATIENT_CLINIC_OR_DEPARTMENT_OTHER): Payer: Self-pay | Admitting: Orthopedic Surgery

## 2024-09-28 ENCOUNTER — Other Ambulatory Visit: Payer: Self-pay

## 2024-09-30 ENCOUNTER — Other Ambulatory Visit: Payer: Self-pay

## 2024-09-30 ENCOUNTER — Encounter (HOSPITAL_BASED_OUTPATIENT_CLINIC_OR_DEPARTMENT_OTHER): Payer: Self-pay | Admitting: Orthopedic Surgery

## 2024-09-30 ENCOUNTER — Encounter (HOSPITAL_BASED_OUTPATIENT_CLINIC_OR_DEPARTMENT_OTHER)
Admission: RE | Disposition: A | Discharge status: Home / Self Care | Payer: Self-pay | Source: Home / Self Care | Attending: Orthopedic Surgery

## 2024-09-30 ENCOUNTER — Ambulatory Visit (HOSPITAL_BASED_OUTPATIENT_CLINIC_OR_DEPARTMENT_OTHER)

## 2024-09-30 ENCOUNTER — Ambulatory Visit (HOSPITAL_BASED_OUTPATIENT_CLINIC_OR_DEPARTMENT_OTHER)
Admission: RE | Admit: 2024-09-30 | Discharge: 2024-09-30 | Disposition: A | Discharge status: Home / Self Care | Attending: Orthopedic Surgery | Admitting: Orthopedic Surgery

## 2024-09-30 DIAGNOSIS — I4891 Unspecified atrial fibrillation: Secondary | ICD-10-CM | POA: Insufficient documentation

## 2024-09-30 DIAGNOSIS — Z791 Long term (current) use of non-steroidal anti-inflammatories (NSAID): Secondary | ICD-10-CM | POA: Insufficient documentation

## 2024-09-30 DIAGNOSIS — Z79899 Other long term (current) drug therapy: Secondary | ICD-10-CM | POA: Insufficient documentation

## 2024-09-30 DIAGNOSIS — I10 Essential (primary) hypertension: Secondary | ICD-10-CM

## 2024-09-30 DIAGNOSIS — M67442 Ganglion, left hand: Secondary | ICD-10-CM | POA: Insufficient documentation

## 2024-09-30 DIAGNOSIS — J45909 Unspecified asthma, uncomplicated: Secondary | ICD-10-CM | POA: Diagnosis not present

## 2024-09-30 DIAGNOSIS — E785 Hyperlipidemia, unspecified: Secondary | ICD-10-CM

## 2024-09-30 DIAGNOSIS — M19042 Primary osteoarthritis, left hand: Secondary | ICD-10-CM | POA: Insufficient documentation

## 2024-09-30 DIAGNOSIS — K219 Gastro-esophageal reflux disease without esophagitis: Secondary | ICD-10-CM | POA: Insufficient documentation

## 2024-09-30 DIAGNOSIS — Z01818 Encounter for other preprocedural examination: Secondary | ICD-10-CM

## 2024-09-30 DIAGNOSIS — Z7982 Long term (current) use of aspirin: Secondary | ICD-10-CM | POA: Diagnosis not present

## 2024-09-30 DIAGNOSIS — Z7951 Long term (current) use of inhaled steroids: Secondary | ICD-10-CM | POA: Insufficient documentation

## 2024-09-30 HISTORY — DX: Prediabetes: R73.03

## 2024-09-30 HISTORY — DX: Hyperlipidemia, unspecified: E78.5

## 2024-09-30 HISTORY — PX: ARTHROTOMY: SHX5728

## 2024-09-30 HISTORY — PX: CYST REMOVAL HAND: SHX6279

## 2024-09-30 SURGERY — REMOVAL, CYST, HAND
Anesthesia: General | Site: Index Finger | Laterality: Left

## 2024-09-30 MED ORDER — PHENYLEPHRINE 80 MCG/ML (10ML) SYRINGE FOR IV PUSH (FOR BLOOD PRESSURE SUPPORT)
PREFILLED_SYRINGE | INTRAVENOUS | Status: AC
  Filled 2024-09-30: qty 10

## 2024-09-30 MED ORDER — DEXAMETHASONE SODIUM PHOSPHATE 4 MG/ML IJ SOLN
INTRAMUSCULAR | Status: DC | PRN
  Administered 2024-09-30: 5 mg via INTRAVENOUS

## 2024-09-30 MED ORDER — HYDROCODONE-ACETAMINOPHEN 5-325 MG PO TABS: 1.0000 | ORAL_TABLET | Freq: Four times a day (QID) | ORAL | 0 refills | Status: AC | PRN

## 2024-09-30 MED ORDER — FENTANYL CITRATE (PF) 100 MCG/2ML IJ SOLN: 25.0000 ug | INTRAMUSCULAR | Status: DC | PRN

## 2024-09-30 MED ORDER — ACETAMINOPHEN 500 MG PO TABS
1000.0000 mg | ORAL_TABLET | Freq: Once | ORAL | Status: AC
  Administered 2024-09-30: 1000 mg via ORAL

## 2024-09-30 MED ORDER — ACETAMINOPHEN 500 MG PO TABS
ORAL_TABLET | ORAL | Status: AC
Start: 2024-09-30 — End: 2024-09-30
  Filled 2024-09-30: qty 2

## 2024-09-30 MED ORDER — PROPOFOL 10 MG/ML IV BOLUS
INTRAVENOUS | Status: DC | PRN
  Administered 2024-09-30: 50 mg via INTRAVENOUS
  Administered 2024-09-30: 150 mg via INTRAVENOUS

## 2024-09-30 MED ORDER — LIDOCAINE 2% (20 MG/ML) 5 ML SYRINGE
INTRAMUSCULAR | Status: DC | PRN
  Administered 2024-09-30: 80 mg via INTRAVENOUS

## 2024-09-30 MED ORDER — OXYCODONE HCL 5 MG/5ML PO SOLN: 5.0000 mg | Freq: Once | ORAL | Status: DC | PRN

## 2024-09-30 MED ORDER — GLYCOPYRROLATE 0.2 MG/ML IJ SOLN
INTRAMUSCULAR | Status: DC | PRN
  Administered 2024-09-30: .2 mg via INTRAVENOUS

## 2024-09-30 MED ORDER — CEFAZOLIN SODIUM 1 G IJ SOLR
INTRAMUSCULAR | Status: AC
  Filled 2024-09-30: qty 20

## 2024-09-30 MED ORDER — CEFAZOLIN SODIUM-DEXTROSE 2-3 GM-%(50ML) IV SOLR
INTRAVENOUS | Status: DC | PRN
  Administered 2024-09-30: 2 g via INTRAVENOUS

## 2024-09-30 MED ORDER — EPHEDRINE 5 MG/ML INJ
INTRAVENOUS | Status: AC
  Filled 2024-09-30: qty 5

## 2024-09-30 MED ORDER — EPHEDRINE SULFATE (PRESSORS) 50 MG/ML IJ SOLN
INTRAMUSCULAR | Status: DC | PRN
  Administered 2024-09-30 (×2): 10 mg via INTRAVENOUS
  Administered 2024-09-30: 5 mg via INTRAVENOUS

## 2024-09-30 MED ORDER — 0.9 % SODIUM CHLORIDE (POUR BTL) OPTIME
TOPICAL | Status: DC | PRN
  Administered 2024-09-30: 1000 mL

## 2024-09-30 MED ORDER — AMISULPRIDE (ANTIEMETIC) 5 MG/2ML IV SOLN: 10.0000 mg | Freq: Once | INTRAVENOUS | Status: DC | PRN

## 2024-09-30 MED ORDER — KETOROLAC TROMETHAMINE 30 MG/ML IJ SOLN
INTRAMUSCULAR | Status: AC
Start: 2024-09-30 — End: 2024-09-30
  Filled 2024-09-30: qty 1

## 2024-09-30 MED ORDER — ONDANSETRON HCL 4 MG/2ML IJ SOLN
INTRAMUSCULAR | Status: DC | PRN
  Administered 2024-09-30: 4 mg via INTRAVENOUS

## 2024-09-30 MED ORDER — PHENYLEPHRINE HCL (PRESSORS) 10 MG/ML IV SOLN
INTRAVENOUS | Status: DC | PRN
  Administered 2024-09-30: 80 ug via INTRAVENOUS
  Administered 2024-09-30: 160 ug via INTRAVENOUS
  Administered 2024-09-30: 80 ug via INTRAVENOUS

## 2024-09-30 MED ORDER — LACTATED RINGERS IV SOLN: INTRAVENOUS | Status: DC

## 2024-09-30 MED ORDER — BUPIVACAINE HCL (PF) 0.25 % IJ SOLN
INTRAMUSCULAR | Status: DC | PRN
Start: 2024-09-30 — End: 2024-09-30
  Administered 2024-09-30: 9 mL

## 2024-09-30 MED ORDER — LIDOCAINE 2% (20 MG/ML) 5 ML SYRINGE
INTRAMUSCULAR | Status: AC
  Filled 2024-09-30: qty 5

## 2024-09-30 MED ORDER — FENTANYL CITRATE (PF) 100 MCG/2ML IJ SOLN
INTRAMUSCULAR | Status: DC | PRN
  Administered 2024-09-30: 50 ug via INTRAVENOUS

## 2024-09-30 MED ORDER — ONDANSETRON HCL 4 MG/2ML IJ SOLN
INTRAMUSCULAR | Status: AC
  Filled 2024-09-30: qty 2

## 2024-09-30 MED ORDER — OXYCODONE HCL 5 MG PO TABS: 5.0000 mg | ORAL_TABLET | Freq: Once | ORAL | Status: DC | PRN

## 2024-09-30 MED ORDER — FENTANYL CITRATE (PF) 100 MCG/2ML IJ SOLN
INTRAMUSCULAR | Status: AC
  Filled 2024-09-30: qty 2

## 2024-09-30 SURGICAL SUPPLY — 31 items
BLADE MINI RND TIP GREEN BEAV (BLADE) IMPLANT
BLADE SURG 15 STRL LF DISP TIS (BLADE) ×2 IMPLANT
BNDG COHESIVE 1X5 TAN STRL LF (GAUZE/BANDAGES/DRESSINGS) IMPLANT
BNDG COMPR ESMARK 4X3 LF (GAUZE/BANDAGES/DRESSINGS) IMPLANT
CHLORAPREP W/TINT 26 (MISCELLANEOUS) ×1 IMPLANT
CORD BIPOLAR FORCEPS 12FT (ELECTRODE) ×1 IMPLANT
COVER BACK TABLE 60X90IN (DRAPES) ×1 IMPLANT
COVER MAYO STAND STRL (DRAPES) ×1 IMPLANT
CUFF TOURN SGL QUICK 18X4 (TOURNIQUET CUFF) ×1 IMPLANT
DRAPE EXTREMITY T 121X128X90 (DISPOSABLE) ×1 IMPLANT
DRAPE SURG 17X23 STRL (DRAPES) ×1 IMPLANT
Finger splint 3.25 IMPLANT
GAUZE SPONGE 4X4 12PLY STRL (GAUZE/BANDAGES/DRESSINGS) ×1 IMPLANT
GAUZE XEROFORM 1X8 LF (GAUZE/BANDAGES/DRESSINGS) ×1 IMPLANT
GLOVE BIOGEL PI IND STRL 7.0 (GLOVE) IMPLANT
GLOVE BIOGEL PI IND STRL 8 (GLOVE) ×1 IMPLANT
GLOVE SURG SS PI 6.5 STRL IVOR (GLOVE) IMPLANT
GLOVE SURG SS PI 7.0 STRL IVOR (GLOVE) IMPLANT
GLOVE SURG SS PI 7.5 STRL IVOR (GLOVE) IMPLANT
GOWN STRL REUS W/ TWL LRG LVL3 (GOWN DISPOSABLE) ×1 IMPLANT
NDL HYPO 25X1 1.5 SAFETY (NEEDLE) ×1 IMPLANT
NEEDLE HYPO 25X1 1.5 SAFETY (NEEDLE) ×1 IMPLANT
NS IRRIG 1000ML POUR BTL (IV SOLUTION) ×1 IMPLANT
PACK BASIN DAY SURGERY FS (CUSTOM PROCEDURE TRAY) ×1 IMPLANT
PADDING CAST ABS COTTON 4X4 ST (CAST SUPPLIES) ×1 IMPLANT
STOCKINETTE 4X48 STRL (DRAPES) ×1 IMPLANT
SUT ETHILON 4 0 PS 2 18 (SUTURE) ×1 IMPLANT
SYR BULB EAR ULCER 3OZ GRN STR (SYRINGE) ×1 IMPLANT
SYR CONTROL 10ML LL (SYRINGE) ×1 IMPLANT
TOWEL GREEN STERILE FF (TOWEL DISPOSABLE) ×2 IMPLANT
UNDERPAD 30X36 HEAVY ABSORB (UNDERPADS AND DIAPERS) ×1 IMPLANT

## 2024-09-30 NOTE — Anesthesia Preprocedure Evaluation (Addendum)
 Anesthesia Evaluation  Patient identified by MRN, date of birth, ID band Patient awake    Reviewed: Allergy & Precautions, NPO status , Patient's Chart, lab work & pertinent test results, reviewed documented beta blocker date and time   Airway Mallampati: II  TM Distance: >3 FB Neck ROM: Full    Dental  (+) Dental Advisory Given, Chipped,    Pulmonary asthma    Pulmonary exam normal breath sounds clear to auscultation       Cardiovascular hypertension, Pt. on medications and Pt. on home beta blockers Normal cardiovascular exam+ dysrhythmias Atrial Fibrillation  Rhythm:Regular Rate:Normal     Neuro/Psych negative neurological ROS  negative psych ROS   GI/Hepatic Neg liver ROS,GERD  ,,  Endo/Other  negative endocrine ROS    Renal/GU negative Renal ROS  negative genitourinary   Musculoskeletal  (+) Arthritis ,    Abdominal   Peds  Hematology negative hematology ROS (+)   Anesthesia Other Findings   Reproductive/Obstetrics                              Anesthesia Physical Anesthesia Plan  ASA: 3  Anesthesia Plan: General   Post-op Pain Management: Tylenol  PO (pre-op)*   Induction: Intravenous  PONV Risk Score and Plan: 3 and Ondansetron , Dexamethasone  and Treatment may vary due to age or medical condition  Airway Management Planned: LMA  Additional Equipment:   Intra-op Plan:   Post-operative Plan: Extubation in OR  Informed Consent: I have reviewed the patients History and Physical, chart, labs and discussed the procedure including the risks, benefits and alternatives for the proposed anesthesia with the patient or authorized representative who has indicated his/her understanding and acceptance.     Dental advisory given  Plan Discussed with: CRNA  Anesthesia Plan Comments:          Anesthesia Quick Evaluation

## 2024-09-30 NOTE — Op Note (Signed)
 NAME: Sharon Wang MEDICAL RECORD NO: [id number] DATE OF BIRTH: 04/20/1946 FACILITY: [facility] LOCATION: [facility] PHYSICIAN: Linnette Panella R. 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  Left index finger excision mucoid cyst 2.  Left index finger debridement of DIP joint   SURGEON:  Magdalen Cabana, M.D.   ASSISTANT: none   ANESTHESIA:  General   INTRAVENOUS FLUIDS:  Per anesthesia flow sheet.   ESTIMATED BLOOD LOSS:  Minimal.   COMPLICATIONS:  None.   SPECIMENS: Left index finger mucoid cyst to pathology   TOURNIQUET TIME:    Total Tourniquet Time Documented: Forearm (Left) - 16 minutes Total: Forearm (Left) - 16 minutes    DISPOSITION:  Stable to PACU.   INDICATIONS: 78 year old female with mucoid cyst of left index finger.  It is bothersome to her.  She wishes to have it removed and the DIP joint debrided to try to prevent recurrence.  Risks, benefits and alternatives of surgery were discussed including the risks of blood loss, infection, damage to nerves, vessels, tendons, ligaments, bone for surgery, need for additional surgery, complications with wound healing, continued pain, stiffness, , recurrence.  She voiced understanding of these risks and elected to proceed.  OPERATIVE COURSE:  After being identified preoperatively by myself,  the patient and I agreed on the procedure and site of the procedure.  The surgical site was marked.  Surgical consent had been signed. Preoperative IV antibiotic prophylaxis was given. She was transferred to the operating room and placed on the operating table in supine position with the left upper extremity on an arm board.  General anesthesia was induced by the anesthesiologist.  Left upper extremity was prepped and draped in normal sterile  orthopedic fashion.  A surgical pause was performed between the surgeons, anesthesia, and operating room staff and all were in agreement as to the patient, procedure, and site of procedure.  Tourniquet at the proximal aspect of the forearm was inflated to 250 mmHg after exsanguination of the arm with an Esmarch bandage.  A hockey-stick shaped incision was made over the DIP joint of the index finger.  This was carried into subcutaneous tissues by spreading technique.  Bipolar electrocautery was used to obtain hemostasis.  The cyst was freed up from surrounding soft tissues.  It was filled with clear gelatinous fluid.  It was removed with the synovectomy rongeurs.  It was sent to pathology for examination.  The DIP joint was entered underneath the extensor tendon.  The synovectomy rongeurs were used to perform a synovectomy of the DIP joint.  The wound and joint were copiously irrigated with sterile saline.  The wound was closed with 4-0 nylon in a horizontal mattress fashion.  A digital block was performed with quarter percent plain Marcaine  to aid in postoperative analgesia.  The wound was dressed with sterile Xeroform 4 x 4 and wrapped with a Coban dressing lightly.  AlumaFoam splint was placed and wrapped lightly with Coban dressing.  The tourniquet was deflated at 16 minutes.  Fingertips were pink with brisk capillary refill after deflation of tourniquet.  The operative  drapes were broken down.  The patient was awoken from anesthesia safely.  She was transferred back to the stretcher and taken to PACU in stable condition.  I will see her back in the  office in 1 week for postoperative followup.  I will give her a prescription for Norco 5/325 1 tab PO q6 hours prn pain, dispense #15.   Hind Chesler, MD Electronically signed, 09/30/24

## 2024-09-30 NOTE — Anesthesia Postprocedure Evaluation (Signed)
 Anesthesia Post Note  Patient: Aribella Ilyas  Procedure(s) Performed: EXCISION OF LEFT INDEX FINGER DIGITAL MUCOUS CYST WITH DEBRIDEMENT OF DISTAL INTERPHALANGEAL JOINT (Left: Index Finger) ARTHROTOMY (Left: Index Finger)     Patient location during evaluation: PACU Anesthesia Type: General Level of consciousness: awake and alert Pain management: pain level controlled Vital Signs Assessment: post-procedure vital signs reviewed and stable Respiratory status: spontaneous breathing, nonlabored ventilation, respiratory function stable and patient connected to nasal cannula oxygen Cardiovascular status: blood pressure returned to baseline and stable Postop Assessment: no apparent nausea or vomiting Anesthetic complications: no   No notable events documented.  Last Vitals:  Vitals:   09/30/24 1045 09/30/24 1114  BP: 114/64 133/66  Pulse: 65 (!) 57  Resp: 11 14  Temp:  (!) 36.1 C  SpO2: 94% 96%    Last Pain:  Vitals:   09/30/24 1114  TempSrc:   PainSc: 0-No pain                 Keithen Capo L Rocklin Soderquist

## 2024-09-30 NOTE — Anesthesia Procedure Notes (Signed)
 Procedure Name: LMA Insertion Date/Time: 09/30/2024 10:05 AM  Performed by: Lenard Lacks, CRNAPre-anesthesia Checklist: Patient identified, Emergency Drugs available, Suction available and Patient being monitored Patient Re-evaluated:Patient Re-evaluated prior to induction Oxygen Delivery Method: Circle system utilized Preoxygenation: Pre-oxygenation with 100% oxygen Induction Type: IV induction Ventilation: Mask ventilation without difficulty LMA: LMA inserted LMA Size: 4.0 Tube type: Oral Number of attempts: 1 Placement Confirmation: ETT inserted through vocal cords under direct vision, positive ETCO2 and breath sounds checked- equal and bilateral Tube secured with: Tape Dental Injury: Teeth and Oropharynx as per pre-operative assessment

## 2024-09-30 NOTE — Discharge Instructions (Addendum)
*  No tylenol  until after 2pm  [facility] Instructions Hand Surgery  Wound Care: Keep your hand elevated above the level of your heart.  Do not allow it to dangle by your side.  Keep the dressing dry and do not remove it unless your doctor advises you to do so.  He will usually change it at the time of your post-op visit.  Moving your fingers is advised to stimulate circulation but will depend on the site of your surgery.  If you have a splint applied, your doctor will advise you regarding movement.  Activity: Do not drive or operate machinery today.  Rest today and then you may return to your normal activity and work as indicated by your physician.  Diet:  Drink liquids today or eat a light diet.  You may resume a regular diet tomorrow.    General expectations: Pain for two to three days. Fingers may become slightly swollen.  Call your doctor if any of the following occur: Severe pain not relieved by pain medication. Elevated temperature. Dressing soaked with blood. Inability to move fingers. White or bluish color to fingers.    Post Anesthesia Home Care Instructions  Activity: Get plenty of rest for the remainder of the day. A responsible individual must stay with you for 24 hours following the procedure.  For the next 24 hours, DO NOT: -Drive a car -Advertising copywriter -Drink alcoholic beverages -Take any medication unless instructed by your physician -Make any legal decisions or sign important papers.  Meals: Start with liquid foods such as gelatin or soup. Progress to regular foods as tolerated. Avoid greasy, spicy, heavy foods. If nausea and/or vomiting occur, drink only clear liquids until the nausea and/or vomiting subsides. Call your physician if vomiting continues.  Special Instructions/Symptoms: Your throat may feel dry or sore from the anesthesia or the breathing tube placed in your throat during surgery. If this causes discomfort, gargle with warm salt water . The  discomfort should disappear within 24 hours.  If you had a scopolamine  patch placed behind your ear for the management of post- operative nausea and/or vomiting:  1. The medication in the patch is effective for 72 hours, after which it should be removed.  Wrap patch in a tissue and discard in the trash. Wash hands thoroughly with soap and water . 2. You may remove the patch earlier than 72 hours if you experience unpleasant side effects which may include dry mouth, dizziness or visual disturbances. 3. Avoid touching the patch. Wash your hands with soap and water  after contact with the patch.

## 2024-09-30 NOTE — Transfer of Care (Signed)
 Immediate Anesthesia Transfer of Care Note  Patient: Sharon Wang  Procedure(s) Performed: EXCISION OF LEFT INDEX FINGER DIGITAL MUCOUS CYST WITH DEBRIDEMENT OF DISTAL INTERPHALANGEAL JOINT (Left: Index Finger) ARTHROTOMY (Left: Index Finger)  Patient Location: PACU  Anesthesia Type:General  Level of Consciousness: oriented  Airway & Oxygen Therapy: Patient Spontanous Breathing and Patient connected to nasal cannula oxygen  Post-op Assessment: Report given to RN and Post -op Vital signs reviewed and stable  Post vital signs: Reviewed and stable  Last Vitals:  Vitals Value Taken Time  BP 121/51 09/30/24 10:38  Temp    Pulse 69 09/30/24 10:39  Resp 13 09/30/24 10:39  SpO2 94 % 09/30/24 10:39  Vitals shown include unfiled device data.  Last Pain:  Vitals:   09/30/24 0748  TempSrc: Temporal  PainSc: 0-No pain      Patients Stated Pain Goal: 3 (09/30/24 0748)  Complications: No notable events documented.

## 2024-09-30 NOTE — H&P (Signed)
 Sharon Wang is an 78 y.o. female.   Chief Complaint: mucoid cyst HPI: 78 yo female with left index finger mucoid cyst and dip joint arthritis.  This is bothersome to her.  She wishes to have it removed and the dip joint debrided to try to prevent recurrence.  Allergies:  Allergies  Allergen Reactions   Aspirin  Nausea And Vomiting and Other (See Comments)    High dose; upsets her GI tract  Other reaction(s): Other (See Comments)  High dose; upsets her GI tract  High dose; upsets her GI tract Tolerates 81 mg   Norvasc [Amlodipine Besylate] Anaphylaxis    Aching joints   Lyrica [Pregabalin] Other (See Comments)    Ineffective   High dose makes me a zombie   Arthrotec [Diclofenac-Misoprostol] Hives   Calcium Channel Blockers Hives   Ciprofloxacin Hives    Patient states the ONLY antibiotic she tolerates is Keflex (cephalexin)   Flagyl [Metronidazole] Hives    Patient states the ONLY antibiotic she tolerates is Keflex (cephalexin)   Isosorbide Other (See Comments)    Headaches.   Penicillins Hives    Patient states the ONLY antibiotic she tolerates is Keflex (cephalexin)   Pravachol [Pravastatin] Hives   Clindamycin/Lincomycin Rash    Patient states the ONLY antibiotic she tolerates is Keflex (cephalexin)   Oxycodone -Acetaminophen  Nausea Only    Other Reaction(s): GI Intolerance   Sulfa Antibiotics Rash    Patient states the ONLY antibiotic she tolerates is Keflex (cephalexin)   Voltaren [Diclofenac] Rash    Past Medical History:  Diagnosis Date   Arthritis    hands, knees and back   Asthma    Complication of anesthesia    a fib during dental surgery   GERD (gastroesophageal reflux disease)    Hyperlipidemia    Hypertension    Pneumonia    Pre-diabetes     Past Surgical History:  Procedure Laterality Date   BLADDER SURGERY     CARDIAC CATHETERIZATION     EYE SURGERY     GANGLION CYST EXCISION Left    HAMMER TOE SURGERY Left    MASS EXCISION Right 07/09/2018    Procedure: EXCISION CYST DEBRIDEMENT DISTAL INTERPHALANGEAL RIGHT INDEX FINGER;  Surgeon: Murrell Kuba, MD;  Location: [facility];  Service: Orthopedics;  Laterality: Right;   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 No pertinent family history.  Social History:   reports that she has never smoked. She has never used smokeless tobacco. She reports current alcohol use. She reports that she does not use drugs.  Medications: Medications Prior to Admission  Medication Sig Dispense Refill   amitriptyline (ELAVIL) 10 MG tablet Take 10 mg by mouth at bedtime.     aspirin  EC 81 MG tablet Take 81 mg by mouth daily. Swallow whole.     atorvastatin (LIPITOR) 40 MG tablet Take 40 mg by mouth daily with breakfast.     Biotin w/ Vitamins C & E (HAIR SKIN & NAILS GUMMIES PO) Take 2 tablets by mouth in the morning.     Calcium-Phosphorus-Vitamin D (CALCIUM GUMMIES PO) Take 2 tablets by mouth in the morning.     carvedilol (COREG) 25 MG tablet 25 mg 2 (two) times daily with a meal.     celecoxib (CELEBREX) 200 MG capsule Take 200 mg by mouth daily with breakfast.  Coenzyme Q10 (COQ-10 PO) Take 200 mg by mouth in the morning.     diltiazem (CARDIZEM CD) 240 MG 24 hr capsule Take 240 mg by mouth daily with breakfast.     FLOVENT HFA 110 MCG/ACT inhaler Inhale 1 puff into the lungs in the morning and at bedtime.     Glucosamine-Chondroitin (OSTEO BI-FLEX REGULAR STRENGTH PO) Take 1 tablet by mouth in the morning.     Lactobacillus-Inulin (CULTURELLE DIGESTIVE HEALTH PO) Take 1 tablet by mouth daily as needed (digestive aid).     loratadine (CLARITIN) 10 MG tablet Take 10 mg by mouth in the morning.     Lutein 40 MG CAPS Take 40 mg by mouth daily.     omeprazole (PRILOSEC) 20 MG capsule Take 20 mg by mouth daily before  breakfast.     Pediatric Multivit-Minerals (FLINTSTONES COMPLETE PO) Take 1 tablet by mouth daily.     HYDROcodone -acetaminophen  (NORCO) 10-325 MG tablet Take 1 tablet by mouth every 4 (four) hours as needed for severe pain. 40 tablet 0   Metamucil Fiber CHEW Chew 1-2 tablets by mouth daily as needed (constipation.). Gummy     OVER THE COUNTER MEDICATION Take 0.5 tablets by mouth at bedtime as needed (Sleep). Equilibrium Comfort Care Blend (Cannabichromene (CBC), Cannabidiol (CBD), Cananbigerol (CBG), Delta 9 Tetrahydrocannabinol (THC), and Cannabinol (CBN)      No results found for this or any previous visit (from the past 48 hours).  No results found.    Blood pressure 138/68, pulse (!) 59, temperature 98 F (36.7 C), temperature source Temporal, height 5' 5 (1.651 m), weight 79.1 kg, SpO2 98%.  General appearance: alert, cooperative, and appears stated age Head: Normocephalic, without obvious abnormality, atraumatic Neck: supple, symmetrical, trachea midline Extremities: Intact sensation and capillary refill all digits.  +epl/fpl/io.  No wounds.  Skin: Skin color, texture, turgor normal. No rashes or lesions Neurologic: Grossly normal Incision/Wound: none  Assessment/Plan Left index finger mucoid cyst and dip arthritis.  Non operative and operative treatment options have been discussed with the patient and patient wishes to proceed with operative treatment. Risks, benefits, and alternatives of surgery have been discussed and the patient agrees with the plan of care.   Harrison Paulson 09/30/2024, 8:41 AM

## 2024-10-01 ENCOUNTER — Encounter (HOSPITAL_BASED_OUTPATIENT_CLINIC_OR_DEPARTMENT_OTHER): Payer: Self-pay | Admitting: Orthopedic Surgery

## 2024-10-01 LAB — SURGICAL PATHOLOGY
